# Patient Record
Sex: Female | Born: 1950 | Race: White | Hispanic: No | Marital: Married | Smoking: Never smoker
Health system: Southern US, Community
[De-identification: ages and names within clinical notes are randomized; demographics above are authoritative.]

## PROBLEM LIST (undated history)

## (undated) DIAGNOSIS — M1990 Unspecified osteoarthritis, unspecified site: Secondary | ICD-10-CM

## (undated) DIAGNOSIS — R112 Nausea with vomiting, unspecified: Secondary | ICD-10-CM

## (undated) DIAGNOSIS — Z9889 Other specified postprocedural states: Secondary | ICD-10-CM

## (undated) HISTORY — PX: OTHER SURGICAL HISTORY: SHX169

## (undated) HISTORY — PX: FRACTURE SURGERY: SHX138

## (undated) HISTORY — PX: ABDOMINAL HYSTERECTOMY: SHX81

## (undated) HISTORY — PX: CHOLECYSTECTOMY: SHX55

---

## 1999-02-24 ENCOUNTER — Encounter: Payer: Self-pay | Admitting: Emergency Medicine

## 1999-02-24 ENCOUNTER — Emergency Department (HOSPITAL_COMMUNITY): Admission: EM | Admit: 1999-02-24 | Discharge: 1999-02-24 | Payer: Self-pay | Admitting: Emergency Medicine

## 2000-07-07 ENCOUNTER — Emergency Department (HOSPITAL_COMMUNITY): Admission: EM | Admit: 2000-07-07 | Discharge: 2000-07-07 | Payer: Self-pay | Admitting: Emergency Medicine

## 2000-07-07 ENCOUNTER — Encounter: Payer: Self-pay | Admitting: Emergency Medicine

## 2000-09-27 ENCOUNTER — Emergency Department (HOSPITAL_COMMUNITY): Admission: EM | Admit: 2000-09-27 | Discharge: 2000-09-28 | Payer: Self-pay | Admitting: Emergency Medicine

## 2000-09-28 ENCOUNTER — Encounter: Payer: Self-pay | Admitting: Emergency Medicine

## 2002-10-29 ENCOUNTER — Emergency Department (HOSPITAL_COMMUNITY): Admission: EM | Admit: 2002-10-29 | Discharge: 2002-10-29 | Payer: Self-pay | Admitting: Emergency Medicine

## 2002-10-29 ENCOUNTER — Encounter: Payer: Self-pay | Admitting: Emergency Medicine

## 2007-12-13 ENCOUNTER — Ambulatory Visit (HOSPITAL_COMMUNITY): Admission: RE | Admit: 2007-12-13 | Discharge: 2007-12-14 | Payer: Self-pay | Admitting: General Surgery

## 2007-12-13 ENCOUNTER — Encounter (HOSPITAL_BASED_OUTPATIENT_CLINIC_OR_DEPARTMENT_OTHER): Payer: Self-pay | Admitting: General Surgery

## 2009-04-27 ENCOUNTER — Emergency Department (HOSPITAL_COMMUNITY): Admission: EM | Admit: 2009-04-27 | Discharge: 2009-04-27 | Payer: Self-pay | Admitting: Emergency Medicine

## 2009-12-04 ENCOUNTER — Encounter: Admission: RE | Admit: 2009-12-04 | Discharge: 2009-12-04 | Payer: Self-pay | Admitting: Family Medicine

## 2011-02-02 NOTE — Op Note (Signed)
NAMESCARLET, ABAD                  ACCOUNT NO.:  [id number]   MEDICAL RECORD NO.:  [id number]          PATIENT TYPE:  AMB   LOCATION:  SDS                          FACILITY:  [facility]   PHYSICIAN:  Leonie Man, M.D.   DATE OF BIRTH:  07-Jan-1951   DATE OF PROCEDURE:  12/13/2007  DATE OF DISCHARGE:                               OPERATIVE REPORT   PREOPERATIVE DIAGNOSIS:  Chronic calculous cholecystitis.   POSTOPERATIVE DIAGNOSIS:  Chronic calculous cholecystitis.   PROCEDURE:  Laparoscopic cholecystectomy, with intraoperative  cholangiogram.   SURGEON:  Leonie Man, M.D.   ASSISTANT:  O.R. nurse.   ANESTHESIA:  General.   [name] is a 60 year old female discovered recently on abdominal  ultrasound to have multiple gallstones.  She presented due to have her  having recurrent episodes of right upper quadrant pain radiating to her  back and associated with nausea and vomiting.  Evaluation of her liver  function studies did show a mild elevation in her total bilirubin to  1.9, alkaline phosphatase was elevated to 154, AST 849, and ALT 597.  Those liver functions have since returned to normal, except for a mild  elevation in her alkaline phosphatase to 138.  Her current total  bilirubin is 0.5.  The patient comes to the operating room now for a  laparoscopic cholecystectomy and intraoperative cholangiogram after the  risks and the potential benefits of surgery have been fully discussed  with her, all questions have been answered, and consent obtained.   PROCEDURE:  The patient is positioned supinely following the induction  of satisfactory general anesthesia.  Her abdomen is prepped and draped  to be included in a sterile operative field.  We identified the patient  as Shelia Davis, and the procedure to be done as laparoscopic  cholecystectomy and cholangiogram.  I then did an open laparoscopy by  infiltrating the umbilicus and incising down into the umbilicus and  inserting a  Hasson cannula into the peritoneal cavity.  This was  insufflated to 14 mmHg pressure using carbon dioxide.  The camera is  inserted and visual exploration of the abdomen carried out.  The  gallbladder was noted to be chronically scarred with multiple adhesions  up to the wall of the gallbladder.  Liver edges were sharp.  Liver  surface is smooth.  The anterior gastric wall and duodenal sweep appear  to be normal.  None of the small or large intestine appear to be  abnormal.  The patient is status post total abdominal hysterectomy.  There were some adhesions in the pelvis, particularly on the right side.   Under direct vision, epigastric and flank ports were placed.  The  gallbladder was grasped and retracted cephalad, and dissection then  carried down into the region of the ampulla, dissecting free the cystic  artery and cystic duct, tracing the cystic artery up to its entry into  the wall of the gallbladder and the cystic duct up to the  gallbladder/cystic duct junction.  The cystic artery was doubly clipped  and transected.  The cystic duct was clipped proximally and  opened.  A  cystic duct cholangiogram was carried out by passing a Cook catheter  into the abdomen and inserting it into the cystic duct, and through it  injecting one-half strength Hypaque dye under fluoroscopic control.  The  resulting contracting cholangiogram showed prompt flow of contrast into  the duodenum, mildly dilated extrahepatic biliary radicals, but there  were no filling defects noted.  The cholangiocatheter was removed, and  the cystic duct was triply clipped and transected.  The gallbladder was  then dissected free from the liver bed using electrocautery and  maintaining hemostasis throughout the entire course of the dissection.  At the end of this dissection, the gallbladder was placed in an  Endopouch, and the liver bed was checked for hemostasis, and additional  bleeding points were treated with  electrocautery.  The camera was now  placed in the epigastric port, and the gallbladder was retrieved through  the umbilical port without difficulty.  Both the flank trocars were  removed under direct vision, and the epigastric port was used to  evacuate the remaining CO2 from the abdomen.  The abdominal wound was  then closed as follows, after sponge, instrument, and sharp counts were  verified.  Umbilical wound closed in two layers with 0 Vicryl and 4-0  Monocryl.  Epigastric and flank wounds were closed with 4-0 Monocryl  sutures.  All incisions were reinforced with Steri-Strips, and sterile  dressings applied.  Anesthetic reversed.  The patient was removed from  the operating room to the recovery room in stable condition.  She  tolerated the procedure well.      Leonie Man, M.D.  Electronically Signed     PB/MEDQ  D:  12/13/2007  T:  12/14/2007  Job:  [id number]   cc:   Holley Bouche, M.D.

## 2011-03-10 ENCOUNTER — Other Ambulatory Visit: Payer: Self-pay | Admitting: Family Medicine

## 2011-03-10 DIAGNOSIS — K7689 Other specified diseases of liver: Secondary | ICD-10-CM

## 2011-04-08 ENCOUNTER — Ambulatory Visit
Admission: RE | Admit: 2011-04-08 | Discharge: 2011-04-08 | Disposition: A | Discharge status: Home / Self Care | Payer: 59 | Source: Ambulatory Visit | Attending: Family Medicine | Admitting: Family Medicine

## 2011-04-08 DIAGNOSIS — K7689 Other specified diseases of liver: Secondary | ICD-10-CM

## 2011-06-14 LAB — DIFFERENTIAL
Basophils Absolute: 0.1
Basophils Relative: 1
Lymphocytes Relative: 34
Neutro Abs: 5.8

## 2011-06-14 LAB — COMPREHENSIVE METABOLIC PANEL
Alkaline Phosphatase: 138 — ABNORMAL HIGH
BUN: 5 — ABNORMAL LOW
CO2: 30
Chloride: 102
Creatinine, Ser: 0.67
GFR calc non Af Amer: >60
Glucose, Bld: 74
Potassium: 3.7
Total Bilirubin: 0.5

## 2011-06-14 LAB — CBC
HCT: 44.4
Hemoglobin: 14.9
MCV: 85.9
RBC: 5.16 — ABNORMAL HIGH
WBC: 10.4

## 2011-06-14 LAB — PROTIME-INR: Prothrombin Time: 12.3

## 2011-06-14 LAB — LIPASE, BLOOD: Lipase: 21

## 2012-07-22 ENCOUNTER — Encounter (HOSPITAL_BASED_OUTPATIENT_CLINIC_OR_DEPARTMENT_OTHER): Payer: Self-pay | Admitting: *Deleted

## 2012-07-22 ENCOUNTER — Emergency Department (HOSPITAL_BASED_OUTPATIENT_CLINIC_OR_DEPARTMENT_OTHER): Payer: 59

## 2012-07-22 ENCOUNTER — Emergency Department (HOSPITAL_BASED_OUTPATIENT_CLINIC_OR_DEPARTMENT_OTHER)
Admission: EM | Admit: 2012-07-22 | Discharge: 2012-07-22 | Disposition: A | Discharge status: Home / Self Care | Payer: 59 | Attending: Emergency Medicine | Admitting: Emergency Medicine

## 2012-07-22 DIAGNOSIS — S82409A Unspecified fracture of shaft of unspecified fibula, initial encounter for closed fracture: Secondary | ICD-10-CM | POA: Insufficient documentation

## 2012-07-22 DIAGNOSIS — S82401A Unspecified fracture of shaft of right fibula, initial encounter for closed fracture: Secondary | ICD-10-CM

## 2012-07-22 DIAGNOSIS — S62308A Unspecified fracture of other metacarpal bone, initial encounter for closed fracture: Secondary | ICD-10-CM

## 2012-07-22 DIAGNOSIS — Z888 Allergy status to other drugs, medicaments and biological substances status: Secondary | ICD-10-CM | POA: Insufficient documentation

## 2012-07-22 DIAGNOSIS — Y929 Unspecified place or not applicable: Secondary | ICD-10-CM | POA: Insufficient documentation

## 2012-07-22 DIAGNOSIS — S92309A Fracture of unspecified metatarsal bone(s), unspecified foot, initial encounter for closed fracture: Secondary | ICD-10-CM | POA: Insufficient documentation

## 2012-07-22 DIAGNOSIS — M159 Polyosteoarthritis, unspecified: Secondary | ICD-10-CM | POA: Insufficient documentation

## 2012-07-22 DIAGNOSIS — Y9301 Activity, walking, marching and hiking: Secondary | ICD-10-CM | POA: Insufficient documentation

## 2012-07-22 DIAGNOSIS — W1789XA Other fall from one level to another, initial encounter: Secondary | ICD-10-CM | POA: Insufficient documentation

## 2012-07-22 HISTORY — DX: Unspecified osteoarthritis, unspecified site: M19.90

## 2012-07-22 MED ORDER — ONDANSETRON 4 MG PO TBDP
4.0000 mg | ORAL_TABLET | Freq: Once | ORAL | Status: AC
  Administered 2012-07-22: 4 mg via ORAL
  Filled 2012-07-22: qty 1

## 2012-07-22 MED ORDER — HYDROCODONE-ACETAMINOPHEN 5-325 MG PO TABS: 2.0000 | ORAL_TABLET | ORAL | Status: DC | PRN

## 2012-07-22 MED ORDER — HYDROCODONE-ACETAMINOPHEN 5-325 MG PO TABS
2.0000 | ORAL_TABLET | Freq: Once | ORAL | Status: AC
  Administered 2012-07-22: 2 via ORAL
  Filled 2012-07-22: qty 2

## 2012-07-22 MED ORDER — ONDANSETRON 4 MG PO TBDP: 4.0000 mg | ORAL_TABLET | Freq: Three times a day (TID) | ORAL | Status: DC | PRN

## 2012-07-22 NOTE — ED Provider Notes (Signed)
History     CSN: [id number]  Arrival date & time 07/22/12  1326   First MD Initiated Contact with Patient 07/22/12 1438      Chief Complaint  Patient presents with  . Ankle Injury    (Consider location/radiation/quality/duration/timing/severity/associated sxs/prior treatment) Patient is a 61 y.o. female presenting with fall. The history is provided by the patient. No language interpreter was used.  Fall The accident occurred less than 1 hour ago. The fall occurred while walking. She fell from a height of 3 to 5 ft. She landed on grass. There was no blood loss. Point of impact: foot. Pain location: foot and ankle r. The pain is at a severity of 7/10. The pain is moderate. She was not ambulatory at the scene. There was no entrapment after the fall. The symptoms are aggravated by activity. She has tried nothing for the symptoms.  Pt complains of pain in both feet and both ankles after tripping over a rock this am.  Past Medical History  Diagnosis Date  . Osteoarthritis     Past Surgical History  Procedure Date  . Cholecystectomy   . Abdominal hysterectomy   . Neck fusion     History reviewed. No pertinent family history.  History  Substance Use Topics  . Smoking status: Never Smoker   . Smokeless tobacco: Not on file  . Alcohol Use: No    OB History    Grav Para Term Preterm Abortions TAB SAB Ect Mult Living                  Review of Systems  Musculoskeletal: Positive for joint swelling and gait problem.  All other systems reviewed and are negative.    Allergies  Phenergan  Home Medications   Current Outpatient Rx  Name Route Sig Dispense Refill  . ALENDRONATE SODIUM 40 MG PO TABS Oral Take 40 mg by mouth every 7 (seven) days. Take with a full glass of water on an empty stomach.      BP 126/104  Pulse 82  Temp 98.1 F (36.7 C)  Resp 20  SpO2 98%  Physical Exam  Nursing note and vitals reviewed. Constitutional: She is oriented to person, place, and  time. She appears well-developed and well-nourished.  HENT:  Head: Normocephalic and atraumatic.  Musculoskeletal: She exhibits edema and tenderness.       Tender swollen right ankle and right foot,    Tender left ankle and left foot,  Pain with moving both feet and ankles.  nv and ns intact  Neurological: She is alert and oriented to person, place, and time. She has normal reflexes.  Skin: Skin is warm.  Psychiatric: She has a normal mood and affect.    ED Course  Procedures (including critical care time)  Labs Reviewed - No data to display Dg Ankle Complete Right  07/22/2012  *RADIOLOGY REPORT*  Clinical Data: ankle injury  RIGHT ANKLE - COMPLETE 3+ VIEW  Comparison: None.  Findings:  There is a minimally displaced fracture involving the lateral malleolus.  Moderate soft tissue swelling is noted.  A comminuted fracture deformity involves the base of the fifth metatarsal with retraction of the fracture fragments by approximately 9 mm.  There is a small posterior calcaneal heel spur.  IMPRESSION:  1.  Comminuted fracture involves the base of the fifth metatarsal bone. 2.  Mildly displaced fracture of the lateral malleolus.   Original Report Authenticated By: Signa Kell, M.D.      No diagnosis  found.    MDM  Pt placed in a posterior splint and given crutches.   I counseled pt on break in foot and ankle.   Pt advised to see Dr. Rayburn Ma for recheck on next week.  Ice to area of pain        Lonia Skinner [place], [place] 07/22/12 1556

## 2012-07-22 NOTE — ED Notes (Signed)
On arrival to patients room to assist with bedpan, patient appears anxious and aggitated. She expresses that in fact, both of her ankles are in excruciating pain. I assisted the patient with bedpan use. Additional ice packs and pillows provided to patient for comfort. Clydie Braun, PA also at bedside to hear patient concerns.Patient has no additional needs at this time.

## 2012-07-22 NOTE — ED Notes (Signed)
Pt presents to ED today with right ankle injury after tripping over rock/curb.  Pt has some swelling noted to right ankle

## 2012-07-22 NOTE — Progress Notes (Signed)
Applied posterior splint to patients right lower extremity. Crutches ordered. Patient refuses crutches stating "I don't need them. I will not use them." Patient expresses that she has a walker at her house that she will use to ambulate. Patient strongly encouraged to use the device and discussed the potential dangers of bearing weight on this extremity. Patient expresses understanding.

## 2012-07-23 NOTE — ED Provider Notes (Signed)
Medical screening examination/treatment/procedure(s) were performed by non-physician practitioner and as supervising physician I was immediately available for consultation/collaboration.  Lashun Ramseyer K Linker, MD 07/23/12 0708 

## 2013-05-25 ENCOUNTER — Ambulatory Visit
Admission: RE | Admit: 2013-05-25 | Discharge: 2013-05-25 | Disposition: A | Discharge status: Home / Self Care | Payer: 59 | Source: Ambulatory Visit | Attending: Family Medicine | Admitting: Family Medicine

## 2013-05-25 ENCOUNTER — Other Ambulatory Visit: Payer: Self-pay | Admitting: Family Medicine

## 2013-05-25 DIAGNOSIS — R0789 Other chest pain: Secondary | ICD-10-CM

## 2013-06-28 ENCOUNTER — Other Ambulatory Visit: Payer: Self-pay | Admitting: Neurosurgery

## 2013-07-23 ENCOUNTER — Encounter (HOSPITAL_COMMUNITY): Payer: Self-pay

## 2013-07-27 ENCOUNTER — Encounter (INDEPENDENT_AMBULATORY_CARE_PROVIDER_SITE_OTHER): Payer: Self-pay

## 2013-07-27 ENCOUNTER — Encounter (HOSPITAL_COMMUNITY): Payer: Self-pay

## 2013-07-27 ENCOUNTER — Encounter (HOSPITAL_COMMUNITY)
Admission: RE | Admit: 2013-07-27 | Discharge: 2013-07-27 | Disposition: A | Discharge status: Home / Self Care | Payer: 59 | Source: Ambulatory Visit | Attending: Neurosurgery | Admitting: Neurosurgery

## 2013-07-27 DIAGNOSIS — Z01812 Encounter for preprocedural laboratory examination: Secondary | ICD-10-CM | POA: Diagnosis present

## 2013-07-27 HISTORY — DX: Other specified postprocedural states: Z98.890

## 2013-07-27 HISTORY — DX: Other specified postprocedural states: R11.2

## 2013-07-27 LAB — BASIC METABOLIC PANEL
BUN: 14 mg/dL (ref 6–23)
Calcium: 9.7 mg/dL (ref 8.4–10.5)
Creatinine, Ser: 0.63 mg/dL (ref 0.50–1.10)
GFR calc Af Amer: >90 mL/min (ref >90)

## 2013-07-27 LAB — CBC
MCH: 28.5 pg (ref 26.0–34.0)
MCHC: 33.7 g/dL (ref 30.0–36.0)
MCV: 84.5 fL (ref 78.0–100.0)
Platelets: 268 10*3/uL (ref 150–400)
RDW: 15.3 % (ref 11.5–15.5)

## 2013-07-27 LAB — SURGICAL PCR SCREEN: MRSA, PCR: NEGATIVE

## 2013-07-27 NOTE — Pre-Procedure Instructions (Signed)
Shelia Davis  07/27/2013   Your procedure is scheduled on:  08/07/13  Report to [facility]  2 * 3 at 530 AM.  Call this number if you have problems the morning of surgery: [phone]   Remember:   Do not eat food or drink liquids after midnight.   Take these medicines the morning of surgery with A SIP OF WATER: hydrocodone,clartin   Do not wear jewelry, make-up or nail polish.  Do not wear lotions, powders, or perfumes. You may wear deodorant.  Do not shave 48 hours prior to surgery. Men may shave face and neck.  Do not bring valuables to the hospital.  [facility] is not responsible                  for any belongings or valuables.               Contacts, dentures or bridgework may not be worn into surgery.  Leave suitcase in the car. After surgery it may be brought to your room.  For patients admitted to the hospital, discharge time is determined by your                treatment team.               Patients discharged the day of surgery will not be allowed to drive  home.  Name and phone number of your driver: family  Special Instructions: Shower using CHG 2 nights before surgery and the night before surgery.  If you shower the day of surgery use CHG.  Use special wash - you have one bottle of CHG for all showers.  You should use approximately 1/3 of the bottle for each shower.   Please read over the following fact sheets that you were given: Pain Booklet, Coughing and Deep Breathing, MRSA Information and Surgical Site Infection Prevention

## 2013-08-06 MED ORDER — CEFAZOLIN SODIUM-DEXTROSE 2-3 GM-% IV SOLR
2.0000 g | INTRAVENOUS | Status: AC
  Administered 2013-08-07: 2 g via INTRAVENOUS
  Filled 2013-08-06: qty 50

## 2013-08-06 NOTE — H&P (Signed)
Shelia Davis is an 62 y.o. female.   Chief Complaint: neck pain HPI: patient complaining of neck pain with radiation to the left trapezius muscle, no better with conservative treatment,. In the past she has a two level fusion at [age].  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 Osteoarthritis   . PONV (postoperative nausea and vomiting)     Past Surgical History  Procedure Laterality Date  . Cholecystectomy    . Abdominal hysterectomy    . Neck fusion    . Falls      numerous breaks from falls,wrist X3, fingers,lt leg,ankle,foot  . Fracture surgery      freq. falls    No family history on file. Social History:  reports that she has never smoked. She does not have any smokeless tobacco history on file. She reports that she does not drink alcohol or use illicit drugs.  Allergies:  Allergies  Allergen Reactions  . Phenergan [Promethazine Hcl]     Pt had past severe reaction     Severe  vomiting    No prescriptions prior to admission    No results found for this or any previous visit (from the past 48 hour(s)). No results found.  Review of Systems  Constitutional: Negative.   Eyes: Negative.   Respiratory: Negative.   Cardiovascular: Negative.   Genitourinary: Negative.   Musculoskeletal: Positive for neck pain.  Skin: Negative.   Neurological: Positive for sensory change and focal weakness.  Endo/Heme/Allergies: Negative.   Psychiatric/Behavioral: Negative.     There were no vitals taken for this visit. Physical Exam hent, nl. Neck, anterior scar. Pain with movement. Cv, nl. Lungs, clear. Abdomen, soft. Extremities, nl. Neuro no weahness , sensory some tinnel sign at wrist ,left.  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 Plan will be to decompress and fuse c-34,. Patient aware of risks and benefits  Dardan Shelton M 08/06/2013, 8:36 PM

## 2013-08-07 ENCOUNTER — Encounter (HOSPITAL_COMMUNITY): Payer: 59 | Admitting: Anesthesiology

## 2013-08-07 ENCOUNTER — Inpatient Hospital Stay (HOSPITAL_COMMUNITY)
Admission: RE | Admit: 2013-08-07 | Discharge: 2013-08-13 | DRG: 473 | Disposition: A | Discharge status: Rehabilitation Facility | Payer: 59 | Source: Ambulatory Visit | Attending: Neurosurgery | Admitting: Neurosurgery

## 2013-08-07 ENCOUNTER — Encounter (HOSPITAL_COMMUNITY)
Admission: RE | Disposition: A | Discharge status: Rehabilitation Facility | Payer: Self-pay | Source: Ambulatory Visit | Attending: Neurosurgery

## 2013-08-07 ENCOUNTER — Encounter (HOSPITAL_COMMUNITY): Payer: Self-pay | Admitting: Surgery

## 2013-08-07 ENCOUNTER — Ambulatory Visit (HOSPITAL_COMMUNITY): Payer: 59 | Admitting: Anesthesiology

## 2013-08-07 ENCOUNTER — Ambulatory Visit (HOSPITAL_COMMUNITY): Payer: 59

## 2013-08-07 DIAGNOSIS — M4802 Spinal stenosis, cervical region: Principal | ICD-10-CM | POA: Diagnosis present

## 2013-08-07 DIAGNOSIS — Z9181 History of falling: Secondary | ICD-10-CM

## 2013-08-07 DIAGNOSIS — Z981 Arthrodesis status: Secondary | ICD-10-CM

## 2013-08-07 DIAGNOSIS — M1990 Unspecified osteoarthritis, unspecified site: Secondary | ICD-10-CM | POA: Diagnosis present

## 2013-08-07 HISTORY — PX: ANTERIOR CERVICAL DECOMP/DISCECTOMY FUSION: SHX1161

## 2013-08-07 SURGERY — ANTERIOR CERVICAL DECOMPRESSION/DISCECTOMY FUSION 1 LEVEL
Anesthesia: General | Wound class: Clean

## 2013-08-07 MED ORDER — LIDOCAINE HCL (CARDIAC) 20 MG/ML IV SOLN
INTRAVENOUS | Status: DC | PRN
  Administered 2013-08-07: 100 mg via INTRAVENOUS
  Administered 2013-08-07: 60 mg via INTRAVENOUS

## 2013-08-07 MED ORDER — NEOSTIGMINE METHYLSULFATE 1 MG/ML IJ SOLN
INTRAMUSCULAR | Status: DC | PRN
  Administered 2013-08-07: 2 mg via INTRAVENOUS

## 2013-08-07 MED ORDER — HYDROMORPHONE HCL PF 1 MG/ML IJ SOLN
0.2500 mg | INTRAMUSCULAR | Status: DC | PRN
  Administered 2013-08-07 (×3): 0.5 mg via INTRAVENOUS

## 2013-08-07 MED ORDER — PHENOL 1.4 % MT LIQD
1.0000 | OROMUCOSAL | Status: DC | PRN
  Administered 2013-08-08 – 2013-08-13 (×4): 1 via OROMUCOSAL
  Filled 2013-08-07: qty 177

## 2013-08-07 MED ORDER — ACETAMINOPHEN 325 MG PO TABS: 650.0000 mg | ORAL_TABLET | ORAL | Status: DC | PRN

## 2013-08-07 MED ORDER — THROMBIN 5000 UNITS EX SOLR
CUTANEOUS | Status: DC | PRN
  Administered 2013-08-07: 5000 [IU] via TOPICAL

## 2013-08-07 MED ORDER — CEFAZOLIN SODIUM 1-5 GM-% IV SOLN
1.0000 g | Freq: Three times a day (TID) | INTRAVENOUS | Status: AC
  Administered 2013-08-07 (×2): 1 g via INTRAVENOUS
  Filled 2013-08-07 (×2): qty 50

## 2013-08-07 MED ORDER — MORPHINE SULFATE 2 MG/ML IJ SOLN
1.0000 mg | INTRAMUSCULAR | Status: DC | PRN
  Administered 2013-08-07 – 2013-08-08 (×4): 4 mg via INTRAVENOUS
  Filled 2013-08-07 (×4): qty 2

## 2013-08-07 MED ORDER — LORATADINE 10 MG PO TABS
10.0000 mg | ORAL_TABLET | Freq: Every day | ORAL | Status: DC
  Administered 2013-08-10 – 2013-08-13 (×4): 10 mg via ORAL
  Filled 2013-08-07 (×6): qty 1

## 2013-08-07 MED ORDER — THROMBIN 5000 UNITS EX SOLR
OROMUCOSAL | Status: DC | PRN
  Administered 2013-08-07: 09:00:00 via TOPICAL

## 2013-08-07 MED ORDER — SODIUM CHLORIDE 0.9 % IV SOLN: 250.0000 mL | INTRAVENOUS | Status: DC

## 2013-08-07 MED ORDER — OXYCODONE HCL 5 MG PO TABS
ORAL_TABLET | ORAL | Status: AC
  Filled 2013-08-07: qty 1

## 2013-08-07 MED ORDER — HEMOSTATIC AGENTS (NO CHARGE) OPTIME
TOPICAL | Status: DC | PRN
  Administered 2013-08-07: 1 via TOPICAL

## 2013-08-07 MED ORDER — DIAZEPAM 5 MG PO TABS
ORAL_TABLET | ORAL | Status: AC
  Administered 2013-08-07: 5 mg
  Filled 2013-08-07: qty 1

## 2013-08-07 MED ORDER — OXYCODONE-ACETAMINOPHEN 5-325 MG PO TABS
1.0000 | ORAL_TABLET | ORAL | Status: DC | PRN
  Administered 2013-08-07 – 2013-08-10 (×6): 2 via ORAL
  Administered 2013-08-10: 1 via ORAL
  Administered 2013-08-10: 2 via ORAL
  Administered 2013-08-10 – 2013-08-13 (×7): 1 via ORAL
  Filled 2013-08-07: qty 2
  Filled 2013-08-07: qty 1
  Filled 2013-08-07 (×2): qty 2
  Filled 2013-08-07: qty 1
  Filled 2013-08-07 (×2): qty 2
  Filled 2013-08-07: qty 1
  Filled 2013-08-07 (×3): qty 2
  Filled 2013-08-07: qty 1
  Filled 2013-08-07: qty 2
  Filled 2013-08-07 (×2): qty 1
  Filled 2013-08-07: qty 2

## 2013-08-07 MED ORDER — DEXAMETHASONE SODIUM PHOSPHATE 4 MG/ML IJ SOLN
4.0000 mg | Freq: Four times a day (QID) | INTRAMUSCULAR | Status: DC
  Administered 2013-08-07 – 2013-08-10 (×5): 4 mg via INTRAVENOUS
  Filled 2013-08-07 (×28): qty 1

## 2013-08-07 MED ORDER — ONDANSETRON HCL 4 MG/2ML IJ SOLN
INTRAMUSCULAR | Status: DC | PRN
  Administered 2013-08-07 (×2): 4 mg via INTRAVENOUS

## 2013-08-07 MED ORDER — ONDANSETRON 4 MG PO TBDP
4.0000 mg | ORAL_TABLET | Freq: Three times a day (TID) | ORAL | Status: DC | PRN
  Administered 2013-08-09: 4 mg via ORAL
  Filled 2013-08-07: qty 1

## 2013-08-07 MED ORDER — DEXAMETHASONE SODIUM PHOSPHATE 4 MG/ML IJ SOLN
INTRAMUSCULAR | Status: DC | PRN
  Administered 2013-08-07: 8 mg via INTRAVENOUS

## 2013-08-07 MED ORDER — ONDANSETRON HCL 4 MG/2ML IJ SOLN: 4.0000 mg | Freq: Four times a day (QID) | INTRAMUSCULAR | Status: DC | PRN

## 2013-08-07 MED ORDER — ACETAMINOPHEN 650 MG RE SUPP: 650.0000 mg | RECTAL | Status: DC | PRN

## 2013-08-07 MED ORDER — HYDROMORPHONE HCL PF 1 MG/ML IJ SOLN
INTRAMUSCULAR | Status: AC
  Administered 2013-08-07: 0.5 mg via INTRAVENOUS
  Filled 2013-08-07: qty 1

## 2013-08-07 MED ORDER — MENTHOL 3 MG MT LOZG: 1.0000 | LOZENGE | OROMUCOSAL | Status: DC | PRN

## 2013-08-07 MED ORDER — SODIUM CHLORIDE 0.9 % IJ SOLN: 3.0000 mL | INTRAMUSCULAR | Status: DC | PRN

## 2013-08-07 MED ORDER — HYDROMORPHONE HCL PF 1 MG/ML IJ SOLN
INTRAMUSCULAR | Status: AC
  Administered 2013-08-07: 0.5 mg
  Filled 2013-08-07: qty 1

## 2013-08-07 MED ORDER — FENTANYL CITRATE 0.05 MG/ML IJ SOLN
INTRAMUSCULAR | Status: DC | PRN
  Administered 2013-08-07: 250 ug via INTRAVENOUS

## 2013-08-07 MED ORDER — GLYCOPYRROLATE 0.2 MG/ML IJ SOLN
INTRAMUSCULAR | Status: DC | PRN
  Administered 2013-08-07: .3 mg via INTRAVENOUS

## 2013-08-07 MED ORDER — SODIUM CHLORIDE 0.9 % IJ SOLN
3.0000 mL | Freq: Two times a day (BID) | INTRAMUSCULAR | Status: DC
  Administered 2013-08-08: 3 mL via INTRAVENOUS

## 2013-08-07 MED ORDER — ONDANSETRON HCL 4 MG/2ML IJ SOLN
4.0000 mg | INTRAMUSCULAR | Status: DC | PRN
  Administered 2013-08-07 – 2013-08-08 (×3): 4 mg via INTRAVENOUS
  Filled 2013-08-07 (×3): qty 2

## 2013-08-07 MED ORDER — LACTATED RINGERS IV SOLN
INTRAVENOUS | Status: DC | PRN
  Administered 2013-08-07 (×2): via INTRAVENOUS

## 2013-08-07 MED ORDER — ROCURONIUM BROMIDE 100 MG/10ML IV SOLN
INTRAVENOUS | Status: DC | PRN
  Administered 2013-08-07: 50 mg via INTRAVENOUS

## 2013-08-07 MED ORDER — 0.9 % SODIUM CHLORIDE (POUR BTL) OPTIME
TOPICAL | Status: DC | PRN
  Administered 2013-08-07: 1000 mL

## 2013-08-07 MED ORDER — PROPOFOL INFUSION 10 MG/ML OPTIME
INTRAVENOUS | Status: DC | PRN
  Administered 2013-08-07: 180 ug/kg/min via INTRAVENOUS

## 2013-08-07 MED ORDER — MIDAZOLAM HCL 5 MG/5ML IJ SOLN
INTRAMUSCULAR | Status: DC | PRN
  Administered 2013-08-07: 2 mg via INTRAVENOUS

## 2013-08-07 MED ORDER — SODIUM CHLORIDE 0.9 % IV SOLN: INTRAVENOUS | Status: DC

## 2013-08-07 MED ORDER — OXYCODONE HCL 5 MG/5ML PO SOLN: 5.0000 mg | Freq: Once | ORAL | Status: AC | PRN

## 2013-08-07 MED ORDER — PROPOFOL 10 MG/ML IV BOLUS
INTRAVENOUS | Status: DC | PRN
  Administered 2013-08-07: 100 mg via INTRAVENOUS
  Administered 2013-08-07: 150 mg via INTRAVENOUS
  Administered 2013-08-07: 100 mg via INTRAVENOUS
  Administered 2013-08-07: 20 mg via INTRAVENOUS
  Administered 2013-08-07: 100 mg via INTRAVENOUS

## 2013-08-07 MED ORDER — SCOPOLAMINE 1 MG/3DAYS TD PT72
MEDICATED_PATCH | TRANSDERMAL | Status: AC
  Administered 2013-08-07: 1 via TRANSDERMAL
  Filled 2013-08-07: qty 1

## 2013-08-07 MED ORDER — DIAZEPAM 5 MG PO TABS
5.0000 mg | ORAL_TABLET | Freq: Four times a day (QID) | ORAL | Status: DC | PRN
  Administered 2013-08-10 – 2013-08-12 (×3): 5 mg via ORAL
  Filled 2013-08-07 (×3): qty 1

## 2013-08-07 MED ORDER — OXYCODONE HCL 5 MG PO TABS
5.0000 mg | ORAL_TABLET | Freq: Once | ORAL | Status: AC | PRN
  Administered 2013-08-07: 5 mg via ORAL

## 2013-08-07 MED ORDER — DEXAMETHASONE 4 MG PO TABS
4.0000 mg | ORAL_TABLET | Freq: Four times a day (QID) | ORAL | Status: DC
  Administered 2013-08-08 – 2013-08-13 (×20): 4 mg via ORAL
  Filled 2013-08-07 (×27): qty 1

## 2013-08-07 SURGICAL SUPPLY — 54 items
BANDAGE GAUZE ELAST BULKY 4 IN (GAUZE/BANDAGES/DRESSINGS) ×4 IMPLANT
BENZOIN TINCTURE PRP APPL 2/3 (GAUZE/BANDAGES/DRESSINGS) ×2 IMPLANT
BIT DRILL SPINE QC 12 (BIT) ×2 IMPLANT
BLADE ULTRA TIP 2M (BLADE) ×2 IMPLANT
BUR BARREL STRAIGHT FLUTE 4.0 (BURR) IMPLANT
BUR MATCHSTICK NEURO 3.0 LAGG (BURR) ×2 IMPLANT
CANISTER SUCT 3000ML (MISCELLANEOUS) ×2 IMPLANT
CONT SPEC 4OZ CLIKSEAL STRL BL (MISCELLANEOUS) ×2 IMPLANT
COVER MAYO STAND STRL (DRAPES) ×2 IMPLANT
DRAPE C-ARM 42X72 X-RAY (DRAPES) ×4 IMPLANT
DRAPE LAPAROTOMY 100X72 PEDS (DRAPES) ×2 IMPLANT
DRAPE MICROSCOPE LEICA (MISCELLANEOUS) ×2 IMPLANT
DRAPE POUCH INSTRU U-SHP 10X18 (DRAPES) ×2 IMPLANT
DURAPREP 6ML APPLICATOR 50/CS (WOUND CARE) ×2 IMPLANT
ELECT REM PT RETURN 9FT ADLT (ELECTROSURGICAL) ×2
ELECTRODE REM PT RTRN 9FT ADLT (ELECTROSURGICAL) ×1 IMPLANT
GAUZE SPONGE 4X4 16PLY XRAY LF (GAUZE/BANDAGES/DRESSINGS) IMPLANT
GLOVE BIOGEL M 8.0 STRL (GLOVE) ×2 IMPLANT
GLOVE BIOGEL PI IND STRL 7.5 (GLOVE) ×2 IMPLANT
GLOVE BIOGEL PI INDICATOR 7.5 (GLOVE) ×2
GLOVE ECLIPSE 7.0 STRL STRAW (GLOVE) ×2 IMPLANT
GLOVE ECLIPSE 8.0 STRL XLNG CF (GLOVE) ×2 IMPLANT
GLOVE EXAM NITRILE LRG STRL (GLOVE) IMPLANT
GLOVE EXAM NITRILE MD LF STRL (GLOVE) IMPLANT
GLOVE EXAM NITRILE XL STR (GLOVE) IMPLANT
GLOVE EXAM NITRILE XS STR PU (GLOVE) IMPLANT
GLOVE SURG SS PI 7.0 STRL IVOR (GLOVE) ×4 IMPLANT
GOWN BRE IMP SLV AUR LG STRL (GOWN DISPOSABLE) ×4 IMPLANT
GOWN BRE IMP SLV AUR XL STRL (GOWN DISPOSABLE) ×2 IMPLANT
GOWN STRL REIN 2XL LVL4 (GOWN DISPOSABLE) IMPLANT
HEMOSTAT POWDER KIT SURGIFOAM (HEMOSTASIS) ×2 IMPLANT
KIT BASIN OR (CUSTOM PROCEDURE TRAY) ×2 IMPLANT
KIT ROOM TURNOVER OR (KITS) ×2 IMPLANT
NEEDLE SPNL 22GX3.5 QUINCKE BK (NEEDLE) ×2 IMPLANT
NS IRRIG 1000ML POUR BTL (IV SOLUTION) ×2 IMPLANT
PACK LAMINECTOMY NEURO (CUSTOM PROCEDURE TRAY) ×2 IMPLANT
PATTIES SURGICAL .5 X1 (DISPOSABLE) ×2 IMPLANT
PLATE ANT CERV XTEND 1 LV 18 (Plate) ×2 IMPLANT
PUTTY DBX 1CC (Putty) ×2 IMPLANT
PUTTY DBX 1CC DEPUY (Putty) ×1 IMPLANT
RUBBERBAND STERILE (MISCELLANEOUS) ×4 IMPLANT
SCREW XTD VAR 4.2 SELF TAP 12 (Screw) ×4 IMPLANT
SCREW XTEND SELFTAP VAR 4.6X14 (Screw) ×2 IMPLANT
SPACER ACDF SM LORDOTIC 7 (Spacer) ×2 IMPLANT
SPONGE GAUZE 4X4 12PLY (GAUZE/BANDAGES/DRESSINGS) ×2 IMPLANT
SPONGE INTESTINAL PEANUT (DISPOSABLE) ×4 IMPLANT
SPONGE SURGIFOAM ABS GEL SZ50 (HEMOSTASIS) ×2 IMPLANT
STRIP CLOSURE SKIN 1/2X4 (GAUZE/BANDAGES/DRESSINGS) ×2 IMPLANT
SUT VIC AB 3-0 SH 8-18 (SUTURE) ×2 IMPLANT
SYR 20ML ECCENTRIC (SYRINGE) ×2 IMPLANT
TAPE CLOTH SURG 4X10 WHT LF (GAUZE/BANDAGES/DRESSINGS) ×2 IMPLANT
TOWEL OR 17X24 6PK STRL BLUE (TOWEL DISPOSABLE) ×2 IMPLANT
TOWEL OR 17X26 10 PK STRL BLUE (TOWEL DISPOSABLE) ×2 IMPLANT
WATER STERILE IRR 1000ML POUR (IV SOLUTION) ×2 IMPLANT

## 2013-08-07 NOTE — Anesthesia Preprocedure Evaluation (Signed)
Anesthesia Evaluation  Patient identified by MRN, date of birth, ID band Patient awake    Reviewed: Allergy & Precautions, H&P , NPO status , Patient's Chart, lab work & pertinent test results  History of Anesthesia Complications (+) PONV  Airway Mallampati: II  Neck ROM: full    Dental   Pulmonary neg pulmonary ROS,          Cardiovascular negative cardio ROS      Neuro/Psych    GI/Hepatic   Endo/Other  obese  Renal/GU      Musculoskeletal   Abdominal   Peds  Hematology   Anesthesia Other Findings   Reproductive/Obstetrics                           Anesthesia Physical Anesthesia Plan  ASA: II  Anesthesia Plan: General   Post-op Pain Management:    Induction: Intravenous  Airway Management Planned: Oral ETT  Additional Equipment:   Intra-op Plan:   Post-operative Plan: Extubation in OR  Informed Consent: I have reviewed the patients History and Physical, chart, labs and discussed the procedure including the risks, benefits and alternatives for the proposed anesthesia with the patient or authorized representative who has indicated his/her understanding and acceptance.     Plan Discussed with: CRNA, Anesthesiologist and Surgeon  Anesthesia Plan Comments:         Anesthesia Quick Evaluation

## 2013-08-07 NOTE — Transfer of Care (Signed)
Immediate Anesthesia Transfer of Care Note  Patient: Shelia Davis  Procedure(s) Performed: Procedure(s) with comments: CERVICAL THREE-FOUR ANTERIOR CERVICAL DECOMPRESSION/DISCECTOMY FUSION 1 LEVEL (N/A) - C3-4 Anterior cervical decompression/diskectomy/fusion  Patient Location: PACU  Anesthesia Type:General  Level of Consciousness: awake, alert  and oriented  Airway & Oxygen Therapy: Patient Spontanous Breathing and Patient connected to face mask oxygen  Post-op Assessment: Report given to PACU RN and Post -op Vital signs reviewed and stable  Post vital signs: Reviewed and stable  Complications: No apparent anesthesia complications

## 2013-08-07 NOTE — Anesthesia Postprocedure Evaluation (Signed)
Anesthesia Post Note  Patient: Shelia Davis  Procedure(s) Performed: Procedure(s) (LRB): CERVICAL THREE-FOUR ANTERIOR CERVICAL DECOMPRESSION/DISCECTOMY FUSION 1 LEVEL (N/A)  Anesthesia type: General  Patient location: PACU  Post pain: Pain level controlled and Adequate analgesia  Post assessment: Post-op Vital signs reviewed, Patient's Cardiovascular Status Stable, Respiratory Function Stable, Patent Airway and Pain level controlled  Last Vitals:  Filed Vitals:   08/07/13 1045  BP:   Pulse: 86  Temp:   Resp: 18    Post vital signs: Reviewed and stable  Level of consciousness: awake, alert  and oriented  Complications: No apparent anesthesia complications

## 2013-08-07 NOTE — Preoperative (Signed)
Beta Blockers   Reason not to administer Beta Blockers:Not Applicable 

## 2013-08-07 NOTE — Progress Notes (Signed)
Op note [id number]

## 2013-08-08 LAB — URINALYSIS, ROUTINE W REFLEX MICROSCOPIC
Bilirubin Urine: NEGATIVE
Glucose, UA: NEGATIVE mg/dL
Hgb urine dipstick: NEGATIVE
Ketones, ur: NEGATIVE mg/dL
Leukocytes, UA: NEGATIVE
Nitrite: NEGATIVE
Protein, ur: NEGATIVE mg/dL
Specific Gravity, Urine: 1.004 — ABNORMAL LOW (ref 1.005–1.030)
Urobilinogen, UA: 0.2 mg/dL (ref 0.0–1.0)
pH: 7 (ref 5.0–8.0)

## 2013-08-08 MED ORDER — MAGIC MOUTHWASH
5.0000 mL | Freq: Four times a day (QID) | ORAL | Status: DC
  Administered 2013-08-08 – 2013-08-13 (×19): 5 mL via ORAL
  Filled 2013-08-08 (×23): qty 5

## 2013-08-08 NOTE — Op Note (Signed)
NAMECAMALA, Shelia Davis NO.:  [id number]  MEDICAL RECORD NO.:  [id number]  LOCATION:  3C11C                        FACILITY:  [facility]  PHYSICIAN:  Hilda Lias, M.D.   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  PROCEDURES:  Anterior C3-4 diskectomy, decompression of the spinal cord, removal of a large calcified disk affecting the left C3 nerve root. Lysis of adhesions.  Interbody fusion with a cage.  Plate.  Microscope.  SURGEON:  Hilda Lias, M.D.  ASSISTANT:  Dr. Conchita Paris.  CLINICAL HISTORY:  The patient in the past has fusion at the level of 4- 5 and 5-6 by me many years ago.  She had been doing really well, but lately, she had been complaining of neck pain radiation to the left shoulder.  This problem had been going on almost a year and she is not any better.  X-rays show severe stenosis at the level of C3-C4 centrally and laterally, left worse than right side.  She realized that she was no bending and the risks were explained to her and her husband.  DESCRIPTION OF PROCEDURE:  The patient was taken to the OR, and after intubation, the left side of the neck was cleaned with DuraPrep.  Then, drapes were applied, and transverse incision was made through the skin, subcutaneous tissue, straight to the cervical area.  We identified the previous surgery and x-ray showed that indeed we were at the level of C3- C4.  We opened the anterior ligament at the level of 3-4 and was started removing quite a bit of degenerative disk.  The disk was quite degenerative and soft.  With the help of the microscope, the total diskectomy was done.  We opened the posterior  ligament and decompression made on the right was the easy medial to the left.  The patient had a calcified disk attached to the dura mater and lysis was accomplished, and at the end having good decompression from the C3 nerve root on the left side.  The area was irrigated.  There was some fibroid pouch, but no evidence of any fluid coming through.  Investigation of the foramen showed it was completely opened and now, we have plenty of room for the both C3 nerve roots.  Then, the endplates were drilled and a cage of 7 mm height, lordotic was introduced.  Inside the cage, we have autograft and DBX.  This was followed by a plate with four screws.  The bone was loose, but we were able to infiltrate the four screws and secured them in place.  Then, the area was irrigated. We investigated for 5 minutes just to be sure we have any bleeding and then the wound was closed with Vicryl and Steri-Strips.          ______________________________ Hilda Lias, M.D.     EB/MEDQ  D:  08/07/2013  T:  08/08/2013  Job:  [id number]

## 2013-08-08 NOTE — Progress Notes (Signed)
UR completed 

## 2013-08-08 NOTE — Evaluation (Signed)
Occupational Therapy Evaluation Patient Details Name: Shelia Davis MRN: [id number] DOB: 1950/12/31 Today's Date: 08/08/2013 Time: 7829-5621 OT Time Calculation (min): 18 min  OT Assessment / Plan / Recommendation History of present illness patient complaining of neck pain with radiation to the left trapezius muscle, no better with conservative treatment,. In the past she has a two level fusion at [age].  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  Pt s/p anterior cervical decompression/discectomy at C3-4.   Clinical Impression   Patient is s/p ACDF C3-4 surgery resulting in functional limitations due to the deficits listed below (see OT problem list).  Patient will benefit from skilled OT acutely to increase independence and safety with ADLS to allow discharge home with (A). Pt very lethargic this session and evaluation limited. Pt is not safe to d/c home at this time.     OT Assessment  Patient needs continued OT Services    Follow Up Recommendations  No OT follow up    Barriers to Discharge      Equipment Recommendations  None recommended by OT    Recommendations for Other Services    Frequency  Min 2X/week    Precautions / Restrictions Precautions Precautions: Cervical Precaution Comments: cervical handout provided and reviewed with spouse   Pertinent Vitals/Pain No pain only "sickness' Pt very nauseated    ADL  Toilet Transfer: Minimal assistance Toilet Transfer Method: Stand pivot (x3 LOB posteriorly) Toilet Transfer Equipment: Raised toilet seat with arms (or 3-in-1 over toilet) Toileting - Clothing Manipulation and Hygiene: Min guard Where Assessed - Toileting Clothing Manipulation and Hygiene: Sit to stand from 3-in-1 or toilet Equipment Used: Gait belt Transfers/Ambulation Related to ADLs: Pt ambulated two steps to 3n1. Pt falling asleep on 3n1. pt needed cues to recall purpose of transfer. Pt needed verbal cue to void bladder to initiate task. Pt unsteady  with x3 lob ADL Comments: Pt completing bed mobility supervision level but very lethargic. Pt sitting EOB with eyes closed. Pt talking about information not relivant to task at hand. pt talking about "working on a farm" , medications she is allergic to, and asking about medications she has already taken. Pt with poor recall. Pt with poor arousal. Pt fall risk    OT Diagnosis: Generalized weakness;Cognitive deficits  OT Problem List: Decreased strength;Decreased activity tolerance;Impaired balance (sitting and/or standing);Decreased safety awareness;Decreased cognition;Decreased knowledge of use of DME or AE;Decreased knowledge of precautions;Pain;Obesity OT Treatment Interventions: Self-care/ADL training;Therapeutic exercise;DME and/or AE instruction;Therapeutic activities;Cognitive remediation/compensation;Patient/family education;Balance training   OT Goals(Current goals can be found in the care plan section) Acute Rehab OT Goals Patient Stated Goal: Feel better OT Goal Formulation: With patient/family Time For Goal Achievement: 08/22/13 Potential to Achieve Goals: Good  Visit Information  Last OT Received On: 08/08/13 Assistance Needed: +1 History of Present Illness: patient complaining of neck pain with radiation to the left trapezius muscle, no better with conservative treatment,. In the past she has a two level fusion at [age].  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  Pt s/p anterior cervical decompression/discectomy at C3-4.       Prior Functioning     Home Living Family/patient expects to be discharged to:: Private residence Living Arrangements: Spouse/significant other Available Help at Discharge: Family Type of Home: House Home Access: Stairs to enter Secretary/administrator of Steps: 5 Entrance Stairs-Rails: Can reach both Home Layout: One level Home Equipment: Bedside commode;Shower seat Additional Comments: Pt works at [employer]. Prior  Function Level  of Independence: Independent Communication Communication: Other (comment) (due to lethargy, pt difficult to understand) Dominant Hand: Right         Vision/Perception Vision - History Baseline Vision: Wears glasses all the time Patient Visual Report: No change from baseline   Cognition  Cognition Arousal/Alertness: Lethargic Behavior During Therapy: Flat affect Overall Cognitive Status: Impaired/Different from baseline Area of Impairment: Attention;Memory;Safety/judgement;Awareness Current Attention Level: Sustained Memory: Decreased short-term memory;Decreased recall of precautions Safety/Judgement: Decreased awareness of deficits Awareness: Anticipatory General Comments: Pt very lethargic during session. pt falling asleep and needing cues for arousal during session.  Difficult to assess due to: Level of arousal    Extremity/Trunk Assessment Upper Extremity Assessment Upper Extremity Assessment: Generalized weakness;RUE deficits/detail;LUE deficits/detail RUE Sensation: decreased light touch RUE Coordination: decreased fine motor;decreased gross motor LUE Sensation: decreased light touch LUE Coordination: decreased gross motor;decreased fine motor Lower Extremity Assessment Lower Extremity Assessment: Defer to PT evaluation Cervical / Trunk Assessment Cervical / Trunk Assessment: Other exceptions (surg)     Mobility Bed Mobility Bed Mobility: Supine to Sit;Sitting - Scoot to Edge of Bed;Sit to Supine Supine to Sit: 5: Supervision;With rails;HOB elevated Sitting - Scoot to Edge of Bed: 5: Supervision;With rail Sit to Supine: 5: Supervision;With rail Details for Bed Mobility Assistance: pt with excellent sequence and good recall from previous surg Transfers Transfers: Sit to Stand;Stand to Sit Sit to Stand: 4: Min guard;With upper extremity assist;From bed Stand to Sit: 4: Min assist;With upper extremity assist;To chair/3-in-1 Details for Transfer  Assistance: x3 LOB returning from 3n1 to bed level      Exercise     Balance     End of Session OT - End of Session Activity Tolerance: Patient limited by lethargy Patient left: in bed;with call bell/phone within reach;with family/visitor present Nurse Communication: Mobility status;Precautions  GO     Harolyn Rutherford 08/08/2013, 5:01 PM Pager: [phone]

## 2013-08-08 NOTE — Evaluation (Signed)
Physical Therapy Evaluation Patient Details Name: Shelia Davis MRN: [id number] DOB: 1951-05-07 Today's Date: 08/08/2013 Time: 4098-1191 PT Time Calculation (min): 17 min  PT Assessment / Plan / Recommendation History of Present Illness  patient complaining of neck pain with radiation to the left trapezius muscle, no better with conservative treatment,. In the past she has a two level fusion at [age].  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  Pt s/p anterior cervical decompression/discectomy at C3-4.  Clinical Impression  Pt lethargic during PT eval and demonstrating staggering gait pattern with moderate hand held assist.  Pt also became ill during gait, and vomited in hallway.  Anticipate pt progressing well once she is less lethargic.  Discussed with husband and he is in agreement.  Will follow pt acutely, but do not feel she will need PT post d/c.    PT Assessment  Patient needs continued PT services    Follow Up Recommendations  No PT follow up    Does the patient have the potential to tolerate intense rehabilitation      Barriers to Discharge        Equipment Recommendations  None recommended by PT    Recommendations for Other Services     Frequency Min 5X/week    Precautions / Restrictions Precautions Precautions: Cervical   Pertinent Vitals/Pain FACES 6/10      Mobility  Bed Mobility Bed Mobility: Supine to Sit Supine to Sit: HOB elevated;With rails;5: Supervision Transfers Transfers: Sit to Stand Sit to Stand: 5: Supervision Ambulation/Gait Ambulation/Gait Assistance: 3: Mod assist Ambulation Distance (Feet): 90 Feet Assistive device: 1 person hand held assist Ambulation/Gait Assistance Details: Pt with staggering gait and required MOD hand held assist plus occasional use of rail. Gait Pattern: Ataxic General Gait Details: Pt became ill during gait and began vomiting in hallway.    Exercises     PT Diagnosis: Difficulty walking;Acute pain  PT  Problem List: Decreased activity tolerance;Decreased balance;Decreased mobility PT Treatment Interventions: Gait training;Stair training;Functional mobility training;Therapeutic activities;Patient/family education     PT Goals(Current goals can be found in the care plan section) Acute Rehab PT Goals Patient Stated Goal: Feel better PT Goal Formulation: With patient Time For Goal Achievement: 08/22/13 Potential to Achieve Goals: Good  Visit Information  Last PT Received On: 08/08/13 Assistance Needed: +1 History of Present Illness: patient complaining of neck pain with radiation to the left trapezius muscle, no better with conservative treatment,. In the past she has a two level fusion at [age].  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  Pt s/p anterior cervical decompression/discectomy at C3-4.       Prior Functioning  Home Living Family/patient expects to be discharged to:: Private residence Living Arrangements: Spouse/significant other Available Help at Discharge: Family Type of Home: House Home Access: Stairs to enter Secretary/administrator of Steps: 5 Entrance Stairs-Rails: Can reach both Home Layout: One level Additional Comments: Pt works at [employer]. Prior Function Level of Independence: Independent Communication Communication: Other (comment) (due to lethargy, pt difficult to understand)    Cognition  Cognition Arousal/Alertness: Lethargic;Suspect due to medications Overall Cognitive Status: Difficult to assess Difficult to assess due to: Level of arousal    Extremity/Trunk Assessment Lower Extremity Assessment Lower Extremity Assessment: Overall WFL for tasks assessed   Balance Balance Balance Assessed: Yes Static Standing Balance Static Standing - Level of Assistance: 4: Min assist  End of Session PT - End of Session Equipment Utilized During Treatment: Gait belt  Activity Tolerance: Patient limited by lethargy;Treatment limited  secondary to medical complications (Comment) (Pt vomiting during gait and incontinent of urine.) Patient left: in bed;with nursing/sitter in room;with family/visitor present Nurse Communication: Mobility status  GP     Mitsuko Luera LUBECK 08/08/2013, 9:37 AM

## 2013-08-08 NOTE — Progress Notes (Signed)
Orthopedic Tech Progress Note Patient Details:  Shelia Davis June 30, 1951 [id number]  Ortho Devices Type of Ortho Device: Soft collar Ortho Device/Splint Interventions: Application   Shawnie Pons 08/08/2013, 12:30 PM

## 2013-08-09 LAB — URINE CULTURE
Colony Count: NO GROWTH
Culture: NO GROWTH

## 2013-08-09 NOTE — Progress Notes (Signed)
Physical Therapy Treatment Patient Details Name: Shelia Davis MRN: [id number] DOB: [birth year] Today's Date: 08/09/2013 Time: 0865-7846 PT Time Calculation (min): 31 min  PT Assessment / Plan / Recommendation  History of Present Illness H/o back fusion, now s/p anterior cervical decompression/discectomy at C3-C4   PT Comments   Pt continues to demonstrate significantly impaired balance with ambulation that was improved but not corrected with assistive device. Pt also demonstrates decreased safety awareness, attention, arousal, memory, and awareness of deficits which in addition to balance deficits place pt at very high risk for falls. Pt in tears with mention of attempting stairs (5 to enter home). Personally do not feel it is safe to attempt stairs with PT due to balance deficits and certainly do not feel safe for husband to try with pt today or tomorrow (also with recent back surgery). Spoke with nursing who also report pt is "all over the place" when trying to ambulate to bathroom. Do not feel pt ready to D/C home and recommend CIR to address deficits listed. WIll continue to follow acutely  Follow Up Recommendations  CIR     Does the patient have the potential to tolerate intense rehabilitation   Yes but she has concerns about intensity  Barriers to Discharge  Decreased caregiver support - husband reports he can take a week off      Equipment Recommendations   (defer to CIR)    Recommendations for Other Services Rehab consult  Frequency Min 5X/week   Progress towards PT Goals Progress towards PT goals: Progressing toward goals  Plan Discharge plan needs to be updated    Precautions / Restrictions Precautions Precautions: Cervical   Pertinent Vitals/Pain Faces 4/10 anterior neck, repositioned    Mobility  Bed Mobility Bed Mobility: Supine to Sit;Sitting - Scoot to Edge of Bed;Sit to Supine Supine to Sit: 5: Supervision;With rails;HOB elevated Sitting - Scoot to Edge of Bed: 5:  Supervision;With rail Sit to Supine: 5: Supervision;With rail Details for Bed Mobility Assistance: Good log roll but slow to initiate. Decreased awareness of positioning in bed, repeatedly repositions to same spot low in bed.  Transfers Transfers: Sit to Stand;Stand to Sit Sit to Stand: 4: Min guard;With upper extremity assist;From bed Stand to Sit: 4: Min assist;With upper extremity assist;To chair/3-in-1 Details for Transfer Assistance: Loss of balance with decreased balance reactions immediately upon standing. Pt felt ill with initial sitting, but did not vomit Ambulation/Gait Ambulation/Gait Assistance: 3: Mod assist Ambulation Distance (Feet): 90 Feet Assistive device: Rolling walker;1 person hand held assist Ambulation/Gait Assistance Details: Pt with very staggering gait, mod assist initially. With RW pt progressed to min/mod assist but has difficulty directing RW (swerves in multiple directions), and decreased safety with RW. Some cross over stepping noted Gait Pattern: Step-through pattern;Scissoring;Narrow base of support (uneven cadence) Stairs:  (unable to attempt)      PT Goals (current goals can now be found in the care plan section) Acute Rehab PT Goals Patient Stated Goal: Feel better  Visit Information  Last PT Received On: 08/09/13 Assistance Needed: +1 History of Present Illness: H/o back fusion, now s/p anterior cervical decompression/discectomy at C3-C4    Subjective Data  Patient Stated Goal: Feel better   Cognition  Cognition Arousal/Alertness: Lethargic Behavior During Therapy: Flat affect Overall Cognitive Status: Impaired/Different from baseline Area of Impairment: Attention;Memory;Safety/judgement;Awareness Current Attention Level: Sustained Memory: Decreased short-term memory;Decreased recall of precautions Safety/Judgement: Decreased awareness of deficits;Decreased awareness of safety Awareness: Anticipatory General Comments: Continues to be  lethargic. Pt  with very staggered gait and when RW retrieved pt reports "I'm doing good, I don't need that" Difficult to assess due to: Level of arousal    Balance  Balance Balance Assessed: Yes Static Standing Balance Static Standing - Level of Assistance: 4: Min assist Dynamic Standing Balance Dynamic Standing - Balance Support: Bilateral upper extremity supported;No upper extremity supported Dynamic Standing - Level of Assistance: 3: Mod assist;4: Min assist Dynamic Standing - Comments: Pt very imbalanced in standing, decreased balance reactions evident. Decreased safety awareness further increases risk for falls.   End of Session PT - End of Session Equipment Utilized During Treatment: Gait belt;Other (comment) (soft cervical collar for comfort) Activity Tolerance: Patient limited by lethargy Patient left: in bed;with family/visitor present Nurse Communication: Mobility status   GP     Wilhemina Bonito 08/09/2013, 9:33 AM

## 2013-08-09 NOTE — Consult Note (Signed)
Physical Medicine and Rehabilitation Consult  Reason for Consult: Cervical radiculopathy with balance deficits Referring Physician: Dr. Jeral Fruit   HPI: Shelia Davis is a 62 y.o. female with history of DDD with gait disorder and prior cervical fusion C 4-C6. 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 Jeral Fruit. Post op lethargy imporving but patient continues with poor safety awareness, unsteady gait as well as numbness in hands. MD, PT recommending CIR.  Had orthostatic episode just before my entry while trying to get to the toilet with RN and tech.   Review of Systems  Constitutional: Positive for malaise/fatigue.  Musculoskeletal: Positive for back pain, joint pain and neck pain.  All other systems reviewed and are negative.    Past Medical History  Diagnosis Date  . Osteoarthritis   . PONV (postoperative nausea and vomiting)    Past Surgical History  Procedure Laterality Date  . Cholecystectomy    . Abdominal hysterectomy    . Neck fusion    . Falls      numerous breaks from falls,wrist X3, fingers,lt leg,ankle,foot  . Fracture surgery      freq. falls   History reviewed. No pertinent family history.  Social History:  Married.  Per reports that she has never smoked. She does not have any smokeless tobacco history on file. Per reports that she does not drink alcohol or use illicit drugs.   Allergies  Allergen Reactions  . Phenergan [Promethazine Hcl]     Pt had past severe reaction     Severe  vomiting   Medications Prior to Admission  Medication Sig Dispense Refill  . alendronate (FOSAMAX) 40 MG tablet Take 40 mg by mouth every 7 (seven) days. Take with a full glass of water on an empty stomach.      . ergocalciferol (VITAMIN D2) 50000 UNITS capsule Take 50,000 Units by mouth once a week.      Marland Kitchen ibuprofen (ADVIL,MOTRIN) 100  MG tablet Take 200 mg by mouth every 6 (six) hours as needed for fever.      . loratadine (CLARITIN) 10 MG tablet Take 10 mg by mouth daily.      Marland Kitchen HYDROcodone-acetaminophen (NORCO/VICODIN) 5-325 MG per tablet Take 2 tablets by mouth every 4 (four) hours as needed for pain.  20 tablet  0  . ondansetron (ZOFRAN ODT) 4 MG disintegrating tablet Take 1 tablet (4 mg total) by mouth every 8 (eight) hours as needed for nausea.  12 tablet  0    Home: Home Living Family/patient expects to be discharged to:: Private residence Living Arrangements: Spouse/significant other Available Help at Discharge: Family Type of Home: House Home Access: Stairs to enter Secretary/administrator of Steps: 5 Entrance Stairs-Rails: Can reach both Home Layout: One level Home Equipment: Bedside commode;Shower seat Additional Comments: Pt works at [employer].  Functional History:   Functional Status:  Mobility: Bed Mobility Bed Mobility: Supine to Sit;Sitting - Scoot to Edge of Bed;Sit to Supine Supine to Sit: 5: Supervision;With rails;HOB elevated Sitting - Scoot to Edge of Bed: 5: Supervision;With rail Sit to Supine: 5: Supervision;With rail Transfers Transfers: Sit to Stand;Stand to Sit Sit to Stand: 4: Min guard;With upper extremity assist;From bed Stand to Sit: 4: Min assist;With upper extremity assist;To chair/3-in-1 Ambulation/Gait Ambulation/Gait Assistance: 3: Mod assist Ambulation Distance (Feet): 90 Feet Assistive device: Rolling walker;1 person  hand held assist Ambulation/Gait Assistance Details: Pt with very staggering gait, mod assist initially. With RW pt progressed to min/mod assist but has difficulty directing RW (swerves in multiple directions), and decreased safety with RW. Some cross over stepping noted Gait Pattern: Step-through pattern;Scissoring;Narrow base of support (uneven cadence) General Gait Details: Pt became ill during gait and began vomiting in hallway. Stairs:   (unable to attempt)    ADL: ADL Toilet Transfer: Minimal assistance Toilet Transfer Method: Stand pivot (x3 LOB posteriorly) Toilet Transfer Equipment: Raised toilet seat with arms (or 3-in-1 over toilet) Equipment Used: Gait belt Transfers/Ambulation Related to ADLs: Pt ambulated two steps to 3n1. Pt falling asleep on 3n1. pt needed cues to recall purpose of transfer. Pt needed verbal cue to void bladder to initiate task. Pt unsteady with x3 lob ADL Comments: Pt completing bed mobility supervision level but very lethargic. Pt sitting EOB with eyes closed. Pt talking about information not relivant to task at hand. pt talking about "working on a farm" , medications she is allergic to, and asking about medications she has already taken. Pt with poor recall. Pt with poor arousal. Pt fall risk  Cognition: Cognition Overall Cognitive Status: Impaired/Different from baseline Orientation Level: Oriented X4 Cognition Arousal/Alertness: Lethargic Behavior During Therapy: Flat affect Overall Cognitive Status: Impaired/Different from baseline Area of Impairment: Attention;Memory;Safety/judgement;Awareness Current Attention Level: Sustained Memory: Decreased short-term memory;Decreased recall of precautions Safety/Judgement: Decreased awareness of deficits;Decreased awareness of safety Awareness: Anticipatory General Comments: Continues to be lethargic. Pt with very staggered gait and when RW retrieved pt reports "I'm doing good, I don't need that" Difficult to assess due to: Level of arousal  Blood pressure 155/83, pulse 83, temperature 98.4 F (36.9 C), temperature source Oral, resp. rate 18, SpO2 92.00%. Physical Exam  Constitutional: She is oriented to person, place, and time. She appears well-developed and well-nourished. No distress.  Groggy, can't keep eyes open  HENT:  Head: Normocephalic and atraumatic.  Eyes: Conjunctivae and EOM are normal. Pupils are equal, round, and reactive to  light.  Neck: Normal range of motion. No JVD present. No tracheal deviation present. No thyromegaly present.  Cardiovascular: Normal rate.   Respiratory: Effort normal.  GI: Soft. She exhibits no distension.  Neurological: She is oriented to person, place, and time.  UE are 3/5 prox to 3+ distally. LE's are grossly 3 to 3+ out of 5 as well. Decreased PP and LT in arms and legs, but inconsistent. DTr's are diminished.  Good insight and awareness.     Results for orders placed during the hospital encounter of 08/07/13 (from the past 24 hour(s))  URINALYSIS, ROUTINE W REFLEX MICROSCOPIC     Status: Abnormal   Collection Time    08/08/13  9:19 PM      Result Value Range   Color, Urine YELLOW  YELLOW   APPearance CLEAR  CLEAR   Specific Gravity, Urine 1.004 (*) 1.005 - 1.030   pH 7.0  5.0 - 8.0   Glucose, UA NEGATIVE  NEGATIVE mg/dL   Hgb urine dipstick NEGATIVE  NEGATIVE   Bilirubin Urine NEGATIVE  NEGATIVE   Ketones, ur NEGATIVE  NEGATIVE mg/dL   Protein, ur NEGATIVE  NEGATIVE mg/dL   Urobilinogen, UA 0.2  0.0 - 1.0 mg/dL   Nitrite NEGATIVE  NEGATIVE   Leukocytes, UA NEGATIVE  NEGATIVE   Dg Cervical Spine 2-3 Views  08/07/2013   CLINICAL DATA:  62 year old female undergoing cervical spine surgery. Initial encounter.  EXAM: CERVICAL SPINE - 2-3 VIEW  COMPARISON:  None.  FINDINGS: Intraoperative portable cross-table lateral views of the cervical spine.  Film at 0834 hrs. Needle directed at the C3-C4 disc space. Underlying C4-C5 interbody fusion.  Film at 0943 hrs.  C3-C4 ACDF hardware in place.  IMPRESSION: Cephalad extension of cervical fusion to the C3-C4 level.   Electronically Signed   By: Augusto Gamble M.D.   On: 08/07/2013 10:34    Assessment/Plan: Diagnosis: cervical spondylosis with radiculopathy s/p C3 nerve root decompression and C3-4 ACDF. ?myelopathic 1. Does the need for close, 24 hr/day medical supervision in concert with the patient's rehab needs make it unreasonable for  this patient to be served in a less intensive setting? Yes 2. Co-Morbidities requiring supervision/potential complications: pain, lethargy, orthostasis 3. Due to bladder management, bowel management, safety, skin/wound care, disease management, medication administration, pain management and patient education, does the patient require 24 hr/day rehab nursing? Yes 4. Does the patient require coordinated care of a physician, rehab nurse, PT (1-2 hrs/day, 5 days/week) and OT (1-2 hrs/day, 5 days/week) to address physical and functional deficits in the context of the above medical diagnosis(es)? Yes Addressing deficits in the following areas: balance, endurance, locomotion, strength, transferring, bowel/bladder control, bathing, dressing, feeding, grooming, toileting and psychosocial support 5. Can the patient actively participate in an intensive therapy program of at least 3 hrs of therapy per day at least 5 days per week? Yes 6. The potential for patient to make measurable gains while on inpatient rehab is excellent 7. Anticipated functional outcomes upon discharge from inpatient rehab are mod I with PT, mod I to supervision with OT, n/a with SLP. 8. Estimated rehab length of stay to reach the above functional goals is: 8-10 days 9. Does the patient have adequate social supports to accommodate these discharge functional goals? Yes 10. Anticipated D/C setting: Home 11. Anticipated post D/C treatments: HH therapy 12. Overall Rehab/Functional Prognosis: excellent  RECOMMENDATIONS: This patient's condition is appropriate for continued rehabilitative care in the following setting: CIR Patient has agreed to participate in recommended program. Yes Note that insurance prior authorization may be required for reimbursement for recommended care.  Comment: Rehab RN to follow up.   Ranelle Oyster, MD, [place] [facility]       08/09/2013

## 2013-08-09 NOTE — Progress Notes (Signed)
Patient ID: Shelia Davis, female   DOB: 1951/06/20, 62 y.o.   MRN: [id number] Wound dry. Unstable while walking with some numbness in hands. Will get rehabilitation to see and possible transfer. Swallows better

## 2013-08-09 NOTE — Progress Notes (Signed)
Rehab Admissions Coordinator Note:  Patient was screened by Trish Mage for appropriateness for an Inpatient Acute Rehab Consult.  At this time, an inpatient rehab consult is pending completion today.  I will follow up once consult is complete.  Trish Mage 08/09/2013, 12:32 PM  I can be reached at [phone].

## 2013-08-09 NOTE — Progress Notes (Signed)
Occupational Therapy Treatment Patient Details Name: Shelia Davis MRN: [id number] DOB: 01-Dec-1950 Today's Date: 08/09/2013 Time: 4098-1191 OT Time Calculation (min): 23 min  OT Assessment / Plan / Recommendation  History of present illness H/o back fusion, now s/p anterior cervical decompression/discectomy at C3-C4   OT comments  Pt currently with balance deficits and unsafe to transfer without staff. Pt sitting on toilet and spouse standing at bathroom while OT obtained a new pad (due to incontinence of patient). Pt's husband turning to return to chair and LOB hitting wall on his Right side. Spouse states "oh I just twisted my knee a little, I am fine," Staff are reporting that family states they have a power chair at home but this was not obtained at evaluation by therapist. Pt with change in grasp/ decr fine motor and incontinence. RN and tech staff report changing patient 45 minutes prior to OT changing patient due to incontinence. Patient is unsafe to d/c home and if denied from CIR will need continued therapy at the highest level of care available. Pt will fall if d/c home with spouse   Follow Up Recommendations  CIR    Barriers to Discharge       Equipment Recommendations  Other (comment) (defer to next venues)    Recommendations for Other Services Rehab consult  Frequency Min 2X/week   Progress towards OT Goals Progress towards OT goals: Progressing toward goals  Plan Discharge plan needs to be updated    Precautions / Restrictions Precautions Precautions: Cervical Required Braces or Orthoses: Cervical Brace Cervical Brace: Soft collar;For comfort   Pertinent Vitals/Pain Tearful and states "my neck hurts"  Pt premedicated. Pt unable to maintain eyes open the entire session    ADL  Grooming: Minimal assistance;Teeth care;Wash/dry face Where Assessed - Grooming: Supported standing Upper Body Dressing: Maximal assistance (don brace ) Where Assessed - Upper Body Dressing:  Unsupported sitting Lower Body Dressing: Maximal assistance Where Assessed - Lower Body Dressing: Supported sit to Pharmacist, hospital: Minimal assistance Toilet Transfer Method: Sit to Barista: Regular height toilet Toileting - Clothing Manipulation and Hygiene: Maximal assistance Where Assessed - Toileting Clothing Manipulation and Hygiene: Sit to stand from 3-in-1 or toilet (unable feel toilet paper in her hands) Equipment Used: Gait belt;Rolling walker;Other (comment) (soft collar) Transfers/Ambulation Related to ADLs: Pt required v/c for hand placement and safety. Pt required use of RW due to unsteady static balance. pt ambulated to bathroom and needed max (A) to side step into bathroom. Pt needed cues for safety with RW. Pt completing toilet transfer then trying to turn with mesh panties still doff at knees. Pt needed max cues to stop and keep patient safe ADL Comments: Pt supine on arrival and agreeable to oral care. Pt supine in bed with noticable bruising at neck on left to center . Pt reports neck hurting with bed mobility but wanted to put soft collar on at eob. Pt with noticeable decr dexterity attempting to hold brace. Pt needed (A) to close brace. Pt sit<>stand at EOB with cues for hand placement. Pt ambulated to bathroom with sway unsteady gait even with RW. pt incontinent of bladder and saturated pad in mesh panties. Pt unable to tell if panties are wet or dry. Pt reports "I can't feel it" Pt mod (A) to doff panties and complete toilet transfer. pt needed extended time with little small voids of urine. pt placing pad in mesh panties and forgot to remove paper to make pad stick to  panties. Pt needed (A) To pull up mesh panties. Pt attempting to transfer and turn with mesh panties down incr risk of falling. pt needed cues to stop to prevent incr fall risk. Pt standing at sink for oral care. pt needed (A) to open tooth paste due to decr fine motor. Pt threading tooth  brush through fingers in an attempt to hold tooth brush. pt holding cup with bil UE. Pt needed max cues to use cup to split to keep from neck flexion. Pt with poor recall and carry over. pt static standing with LOB in all directions at sink. Pt with a sway to her inbalance. Pt return to supine position with spouse present. Pt and spouse educated that OT does not recommend d/c home at this time.    OT Diagnosis:    OT Problem List:   OT Treatment Interventions:     OT Goals(current goals can now be found in the care plan section) Acute Rehab OT Goals Patient Stated Goal: Feel better OT Goal Formulation: With patient/family Time For Goal Achievement: 08/22/13 Potential to Achieve Goals: Good ADL Goals Pt Will Perform Grooming: with modified independence;standing Pt Will Perform Upper Body Dressing: with modified independence;sitting Pt Will Perform Lower Body Dressing: with modified independence;sit to/from stand Pt Will Transfer to Toilet: with modified independence;bedside commode  Visit Information  Last OT Received On: 08/09/13 Assistance Needed: +1 History of Present Illness: H/o back fusion, now s/p anterior cervical decompression/discectomy at C3-C4    Subjective Data      Prior Functioning       Cognition  Cognition Arousal/Alertness: Lethargic Behavior During Therapy: Flat affect Overall Cognitive Status: Impaired/Different from baseline Area of Impairment: Memory;Safety/judgement Current Attention Level: Sustained Memory: Decreased short-term memory Safety/Judgement: Decreased awareness of deficits Awareness: Anticipatory General Comments: Continues to be lethargic. Pt with very staggered gait and when RW retrieved pt reports "I'm doing good, I don't need that" Difficult to assess due to: Level of arousal    Mobility  Bed Mobility Bed Mobility: Supine to Sit;Sitting - Scoot to Edge of Bed;Sit to Supine Supine to Sit: 4: Min assist;With rails;HOB elevated Sitting  - Scoot to Edge of Bed: 4: Min guard;With rail Sit to Supine: 4: Min guard;With rail;HOB elevated Details for Bed Mobility Assistance: pt requires use of bed rails. pt placing arm through the rail to use upper arm to help hold rail. Pt is unable to completely grasp rail without arm hooknig the rail Transfers Transfers: Sit to Stand;Stand to Sit Sit to Stand: 4: Min guard;With upper extremity assist;From bed Stand to Sit: 4: Min assist;With upper extremity assist;To toilet Details for Transfer Assistance: cues for safety and use of RW.    Exercises      Balance Balance Balance Assessed: Yes Static Standing Balance Static Standing - Level of Assistance: 4: Min assist Dynamic Standing Balance Dynamic Standing - Balance Support: Bilateral upper extremity supported;No upper extremity supported Dynamic Standing - Level of Assistance: 3: Mod assist;4: Min assist Dynamic Standing - Comments: Pt very imbalanced in standing, decreased balance reactions evident. Decreased safety awareness further increases risk for falls.  High Level Balance High Level Balance Activites: Direction changes;Turns   End of Session OT - End of Session Activity Tolerance: Patient limited by lethargy Patient left: in bed;with call bell/phone within reach;with family/visitor present Nurse Communication: Mobility status;Precautions  GO     Harolyn Rutherford 08/09/2013, 11:54 AM Pager: [phone]

## 2013-08-10 ENCOUNTER — Encounter (HOSPITAL_COMMUNITY): Payer: Self-pay | Admitting: Neurosurgery

## 2013-08-10 DIAGNOSIS — M5412 Radiculopathy, cervical region: Secondary | ICD-10-CM

## 2013-08-10 DIAGNOSIS — M47812 Spondylosis without myelopathy or radiculopathy, cervical region: Secondary | ICD-10-CM

## 2013-08-10 MED ORDER — FLEET ENEMA 7-19 GM/118ML RE ENEM
1.0000 | ENEMA | Freq: Every day | RECTAL | Status: DC | PRN
  Administered 2013-08-13: 1 via RECTAL
  Filled 2013-08-10: qty 1

## 2013-08-10 NOTE — Progress Notes (Signed)
Physical Therapy Treatment Patient Details Name: Shelia Davis MRN: [id number] DOB: 1951/09/15 Today's Date: 08/10/2013 Time: 7829-5621 PT Time Calculation (min): 36 min  PT Assessment / Plan / Recommendation  History of Present Illness H/o back fusion, now s/p anterior cervical decompression/discectomy at C3-C4   PT Comments   Pt continues to display balance disturbances and decreased awareness of safety or deficits. Pt with increased motivation to ambulate in the hallways and improve her balance, however pt became short of breath and stated her legs felt weak. She insisted on continuing and refused to turn back to the room, and therapist ended gait training. Upon sitting on EOB, symptoms resolved and pt was returned to supine. Husband reports she is improving, but hopes she will be admitted to inpatient rehab.  Follow Up Recommendations  CIR     Does the patient have the potential to tolerate intense rehabilitation     Barriers to Discharge        Equipment Recommendations  None recommended by PT    Recommendations for Other Services    Frequency Min 5X/week   Progress towards PT Goals Progress towards PT goals: Progressing toward goals  Plan Current plan remains appropriate    Precautions / Restrictions Precautions Precautions: Cervical Required Braces or Orthoses: Cervical Brace Cervical Brace: Soft collar;For comfort Restrictions Weight Bearing Restrictions: No   Pertinent Vitals/Pain Pt reports no pain throughout session.     Mobility  Bed Mobility Bed Mobility: Supine to Sit;Sit to Supine Supine to Sit: 4: Min guard;HOB elevated;With rails Sit to Supine: 4: Min guard;With rail;HOB flat Details for Bed Mobility Assistance: Heavy use of bed rails for support. Husband comments "this is the best she's done since she's been here." Transfers Transfers: Sit to Stand;Stand to Sit Sit to Stand: 4: Min guard;From toilet;From bed;With upper extremity assist Stand to Sit: 4:  Min guard;To toilet;To bed;With upper extremity assist Details for Transfer Assistance: Increased time to come to full stand but no physical assist required. Pt was able to control her descent to the commode and the bed with minimal hand use. Ambulation/Gait Ambulation/Gait Assistance: 4: Min assist Ambulation Distance (Feet): 200 Feet (x2 with rest break between attempts.) Assistive device: Rolling walker Ambulation/Gait Assistance Details: Pt pushing herself during session today to ambulate farther than previous sessions. Pt states she is weak and feels fatigued but insists to continue ambulating despite therapist's encouragement to return to room. Standing rest breaks required for SOB. Min assist for occasional LOB. Gait Pattern: Step-through pattern;Decreased stride length;Scissoring;Antalgic Gait velocity: Decreased Stairs: No    Exercises     PT Diagnosis:    PT Problem List:   PT Treatment Interventions:     PT Goals (current goals can now be found in the care plan section) Acute Rehab PT Goals Patient Stated Goal: Feel better PT Goal Formulation: With patient Time For Goal Achievement: 08/22/13 Potential to Achieve Goals: Good  Visit Information  Last PT Received On: 08/10/13 Assistance Needed: +1 History of Present Illness: H/o back fusion, now s/p anterior cervical decompression/discectomy at C3-C4    Subjective Data  Subjective: "I've been waiting for you." Patient Stated Goal: Feel better   Cognition  Cognition Arousal/Alertness: Lethargic Behavior During Therapy: Flat affect Overall Cognitive Status: Impaired/Different from baseline Area of Impairment: Memory;Safety/judgement Current Attention Level: Sustained Memory: Decreased short-term memory Safety/Judgement: Decreased awareness of deficits Awareness: Anticipatory General Comments: Pt continues to be lethargic and keeps eyes closed when sitting on EOB or on commode. Difficult to assess  due to: Level of  arousal    Balance  Balance Balance Assessed: Yes Static Sitting Balance Static Sitting - Balance Support: Feet supported;No upper extremity supported Static Sitting - Level of Assistance: 5: Stand by assistance Static Standing Balance Static Standing - Balance Support: Bilateral upper extremity supported Static Standing - Level of Assistance: 4: Min assist  End of Session PT - End of Session Equipment Utilized During Treatment: Gait belt;Cervical collar Activity Tolerance: Patient limited by fatigue Patient left: in bed;with family/visitor present Nurse Communication: Mobility status   GP     Ruthann Cancer 08/10/2013, 5:10 PM  Ruthann Cancer, PT, DPT [phone]

## 2013-08-10 NOTE — Progress Notes (Signed)
Patient ID: Shelia Davis, female   DOB: 10/10/1950, 62 y.o.   MRN: [id number] Sleepy, ambulating with help. Swallows better. Waiting for insurance green light to go to rehabilitation

## 2013-08-10 NOTE — Progress Notes (Signed)
INITIAL NUTRITION ASSESSMENT  DOCUMENTATION CODES Per approved criteria  -Obesity Unspecified   INTERVENTION: No nutrition intervention at this time --- patient declined RD to follow for nutrition care plan  NUTRITION DIAGNOSIS: Inadequate oral intake related to throat pain as evidenced by patient report, PO intake 40%  Goal: Pt to meet >/= 90% of their estimated nutrition needs   Monitor:  PO intake, weight, labs, I/O's  Reason for Assessment: Malnutrition Screening Tool Report  62 y.o. 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  Patient s/p procedure 11/19: ANTERIOR C3-C4 DISKECTOMY DECOMPRESSION OF SPINAL CORD REMOVAL OF LARGE CALCIFIED DISK AT LEFT C3 NERVE ROOT LYSIS OF ADHESIONS INTERBODY FUSION   Patient reports she only had her grits this morning.  Complaining of throat pain.  Declined addition of nutrition supplements.  PT recommending [facility].  Height: Ht Readings from Last 1 Encounters:  07/27/13 5' 5.5" (1.664 m)    Weight: Wt Readings from Last 1 Encounters:  07/27/13 215 lb 4 oz (97.637 kg)    Ideal Body Weight: 125 lb  % Ideal Body Weight: 172%  Wt Readings from Last 10 Encounters:  07/27/13 215 lb 4 oz (97.637 kg)    Usual Body Weight: ---  % Usual Body Weight: ---  BMI: 35.2 kg/m2  Estimated Nutritional Needs: Kcal: 1750-1950 Protein: 80-90 gm Fluid: 1.7-1.9 L  Skin: neck incision   Diet Order: General  EDUCATION NEEDS: -No education needs identified at this time  Scheduled Meds: . dexamethasone  4 mg Intravenous Q6H   Or  . dexamethasone  4 mg Oral Q6H  . loratadine  10 mg Oral Daily  . magic mouthwash  5 mL Oral QID    Continuous Infusions: . sodium chloride       Past Medical History  Diagnosis Date  . Osteoarthritis   . PONV (postoperative nausea and vomiting)     Past Surgical History  Procedure Laterality Date  . Cholecystectomy    . Abdominal hysterectomy    . Neck fusion    . Falls      numerous breaks from falls,wrist X3, fingers,lt leg,ankle,foot  . Fracture surgery      freq. falls    Maureen Chatters, RD, LDN Pager #: [phone] After-Hours Pager #: [phone]

## 2013-08-10 NOTE — Progress Notes (Signed)
Pt. Had a coughing fit after getting out of bed. The cough was deep and produced a small amount of blood tinged sputum. She was having difficulty calming herself down after getting up from the bed with 10/10 pain. Eventually she got back into bed and was able to relax. Will continue to monitor.

## 2013-08-11 MED ORDER — PHENOL 1.4 % MT LIQD: 1.0000 | OROMUCOSAL | Status: DC | PRN

## 2013-08-11 NOTE — Progress Notes (Signed)
Subjective: Patient reports starting to make progress  Objective: Vital signs in last 24 hours: Temp:  [98 F (36.7 C)-98.6 F (37 C)] 98.6 F (37 C) (11/22 0700) Pulse Rate:  [72-78] 75 (11/22 0700) Resp:  [18-20] 20 (11/22 0700) BP: (150-166)/(78-95) 166/78 mmHg (11/22 0700) SpO2:  [92 %-96 %] 96 % (11/22 0700)  Intake/Output from previous day:   Intake/Output this shift:    Physical Exam: Full strength with encouragement in upper extremities  Lab Results: No results found for this basename: WBC, HGB, HCT, PLT,  in the last 72 hours BMET No results found for this basename: NA, K, CL, CO2, GLUCOSE, BUN, CREATININE, CALCIUM,  in the last 72 hours  Studies/Results: No results found.  Assessment/Plan: Mobilizing slowly.  Awaiting Rehab bed.    LOS: 4 days    Dorian Heckle, MD 08/11/2013, 9:22 AM

## 2013-08-12 MED ORDER — SENNA 8.6 MG PO TABS
1.0000 | ORAL_TABLET | Freq: Two times a day (BID) | ORAL | Status: DC
  Administered 2013-08-12 – 2013-08-13 (×3): 8.6 mg via ORAL
  Filled 2013-08-12 (×3): qty 1

## 2013-08-12 MED ORDER — POLYETHYLENE GLYCOL 3350 17 G PO PACK
17.0000 g | PACK | Freq: Every day | ORAL | Status: DC
  Administered 2013-08-12 – 2013-08-13 (×2): 17 g via ORAL
  Filled 2013-08-12 (×2): qty 1

## 2013-08-12 NOTE — Progress Notes (Signed)
Pt ambulated about 200 feet on the unit with and gait belt. Tolerated fairly.

## 2013-08-12 NOTE — Progress Notes (Signed)
Overall unchanged. Still difficulty with pain and mobilization. Awaiting rehabilitation.  Afebrile. Vital stable. Exam stable.  Continue efforts at mobilization and therapy. Plan rehabilitation admission when bed available.

## 2013-08-13 ENCOUNTER — Inpatient Hospital Stay (HOSPITAL_COMMUNITY)
Admission: RE | Admit: 2013-08-13 | Discharge: 2013-08-23 | DRG: 945 | Disposition: A | Discharge status: Home / Self Care | Payer: 59 | Source: Intra-hospital | Attending: Physical Medicine & Rehabilitation | Admitting: Physical Medicine & Rehabilitation

## 2013-08-13 DIAGNOSIS — R5381 Other malaise: Secondary | ICD-10-CM | POA: Diagnosis present

## 2013-08-13 DIAGNOSIS — F3289 Other specified depressive episodes: Secondary | ICD-10-CM | POA: Diagnosis present

## 2013-08-13 DIAGNOSIS — M5412 Radiculopathy, cervical region: Secondary | ICD-10-CM | POA: Diagnosis present

## 2013-08-13 DIAGNOSIS — M4802 Spinal stenosis, cervical region: Secondary | ICD-10-CM | POA: Diagnosis present

## 2013-08-13 DIAGNOSIS — K5900 Constipation, unspecified: Secondary | ICD-10-CM | POA: Diagnosis present

## 2013-08-13 DIAGNOSIS — M4712 Other spondylosis with myelopathy, cervical region: Secondary | ICD-10-CM | POA: Diagnosis present

## 2013-08-13 DIAGNOSIS — R269 Unspecified abnormalities of gait and mobility: Secondary | ICD-10-CM | POA: Diagnosis present

## 2013-08-13 DIAGNOSIS — R209 Unspecified disturbances of skin sensation: Secondary | ICD-10-CM | POA: Diagnosis present

## 2013-08-13 DIAGNOSIS — Z5189 Encounter for other specified aftercare: Principal | ICD-10-CM

## 2013-08-13 DIAGNOSIS — D72829 Elevated white blood cell count, unspecified: Secondary | ICD-10-CM | POA: Diagnosis present

## 2013-08-13 DIAGNOSIS — F329 Major depressive disorder, single episode, unspecified: Secondary | ICD-10-CM | POA: Diagnosis present

## 2013-08-13 DIAGNOSIS — Z981 Arthrodesis status: Secondary | ICD-10-CM

## 2013-08-13 DIAGNOSIS — M5030 Other cervical disc degeneration, unspecified cervical region: Secondary | ICD-10-CM | POA: Diagnosis present

## 2013-08-13 DIAGNOSIS — F0390 Unspecified dementia without behavioral disturbance: Secondary | ICD-10-CM | POA: Diagnosis present

## 2013-08-13 LAB — URINALYSIS, ROUTINE W REFLEX MICROSCOPIC
Bilirubin Urine: NEGATIVE
Glucose, UA: NEGATIVE mg/dL
Hgb urine dipstick: NEGATIVE
Leukocytes, UA: NEGATIVE
Specific Gravity, Urine: 1.015 (ref 1.005–1.030)
Urobilinogen, UA: 0.2 mg/dL (ref 0.0–1.0)

## 2013-08-13 MED ORDER — SENNA 8.6 MG PO TABS
2.0000 | ORAL_TABLET | Freq: Two times a day (BID) | ORAL | Status: DC
  Administered 2013-08-13 – 2013-08-23 (×20): 17.2 mg via ORAL
  Filled 2013-08-13 (×24): qty 2

## 2013-08-13 MED ORDER — MAGIC MOUTHWASH
5.0000 mL | Freq: Four times a day (QID) | ORAL | Status: DC
  Administered 2013-08-13 – 2013-08-23 (×37): 5 mL via ORAL
  Filled 2013-08-13 (×42): qty 5

## 2013-08-13 MED ORDER — FLEET ENEMA 7-19 GM/118ML RE ENEM
1.0000 | ENEMA | Freq: Every day | RECTAL | Status: DC | PRN
  Administered 2013-08-14: 1 via RECTAL
  Filled 2013-08-13 (×2): qty 1

## 2013-08-13 MED ORDER — FAMOTIDINE 20 MG PO TABS: 20.0000 mg | ORAL_TABLET | Freq: Two times a day (BID) | ORAL | Status: DC

## 2013-08-13 MED ORDER — ONDANSETRON HCL 4 MG/2ML IJ SOLN: 4.0000 mg | INTRAMUSCULAR | Status: DC | PRN

## 2013-08-13 MED ORDER — POLYETHYLENE GLYCOL 3350 17 G PO PACK: 17.0000 g | PACK | Freq: Every day | ORAL | Status: DC

## 2013-08-13 MED ORDER — MENTHOL 3 MG MT LOZG
1.0000 | LOZENGE | OROMUCOSAL | Status: DC | PRN
  Filled 2013-08-13: qty 9

## 2013-08-13 MED ORDER — FAMOTIDINE 20 MG PO TABS
20.0000 mg | ORAL_TABLET | Freq: Two times a day (BID) | ORAL | Status: DC
  Administered 2013-08-13 – 2013-08-23 (×20): 20 mg via ORAL
  Filled 2013-08-13 (×22): qty 1

## 2013-08-13 MED ORDER — ONDANSETRON 4 MG PO TBDP: 4.0000 mg | ORAL_TABLET | Freq: Three times a day (TID) | ORAL | Status: DC | PRN

## 2013-08-13 MED ORDER — DEXAMETHASONE 4 MG PO TABS
4.0000 mg | ORAL_TABLET | Freq: Four times a day (QID) | ORAL | Status: DC
  Administered 2013-08-13 – 2013-08-14 (×4): 4 mg via ORAL
  Filled 2013-08-13 (×7): qty 1

## 2013-08-13 MED ORDER — BISACODYL 10 MG RE SUPP
10.0000 mg | Freq: Every day | RECTAL | Status: DC | PRN
  Administered 2013-08-14: 10 mg via RECTAL
  Filled 2013-08-13: qty 1

## 2013-08-13 MED ORDER — LORATADINE 10 MG PO TABS
10.0000 mg | ORAL_TABLET | Freq: Every day | ORAL | Status: DC
  Administered 2013-08-14 – 2013-08-23 (×10): 10 mg via ORAL
  Filled 2013-08-13 (×12): qty 1

## 2013-08-13 MED ORDER — ONDANSETRON 4 MG PO TBDP
4.0000 mg | ORAL_TABLET | Freq: Three times a day (TID) | ORAL | Status: DC | PRN
  Filled 2013-08-13: qty 1

## 2013-08-13 MED ORDER — PHENOL 1.4 % MT LIQD: 1.0000 | OROMUCOSAL | Status: DC | PRN

## 2013-08-13 MED ORDER — METHOCARBAMOL 500 MG PO TABS
500.0000 mg | ORAL_TABLET | Freq: Four times a day (QID) | ORAL | Status: DC | PRN
  Administered 2013-08-15: 750 mg via ORAL
  Administered 2013-08-15 – 2013-08-17 (×3): 500 mg via ORAL
  Administered 2013-08-18 – 2013-08-21 (×4): 750 mg via ORAL
  Administered 2013-08-21: 500 mg via ORAL
  Filled 2013-08-13 (×3): qty 2
  Filled 2013-08-13 (×3): qty 1
  Filled 2013-08-13 (×2): qty 2
  Filled 2013-08-13: qty 1

## 2013-08-13 MED ORDER — OXYCODONE-ACETAMINOPHEN 5-325 MG PO TABS
1.0000 | ORAL_TABLET | ORAL | Status: DC | PRN
  Administered 2013-08-13: 2 via ORAL
  Administered 2013-08-14: 1 via ORAL
  Administered 2013-08-14: 2 via ORAL
  Administered 2013-08-14 – 2013-08-15 (×3): 1 via ORAL
  Filled 2013-08-13: qty 1
  Filled 2013-08-13: qty 2
  Filled 2013-08-13: qty 1
  Filled 2013-08-13: qty 2
  Filled 2013-08-13 (×2): qty 1

## 2013-08-13 MED ORDER — SENNA 8.6 MG PO TABS: 1.0000 | ORAL_TABLET | Freq: Two times a day (BID) | ORAL | Status: AC

## 2013-08-13 MED ORDER — OXYCODONE-ACETAMINOPHEN 5-325 MG PO TABS: 1.0000 | ORAL_TABLET | ORAL | Status: DC | PRN

## 2013-08-13 MED ORDER — PHENOL 1.4 % MT LIQD
1.0000 | OROMUCOSAL | Status: DC | PRN
  Filled 2013-08-13: qty 177

## 2013-08-13 NOTE — Progress Notes (Addendum)
Rehab admissions - Evaluated for possible admission.  I met with patient, gave her rehab brochures and answered her questions.  I have spoken with the [facility] on site reviewer to request acute inpatient rehab admission.  I have called [employer] and have opened the case.  Will await call back from [facility] case manager.  Call me for questions.  #[phone]  I have approval from [facility] on site reviewer.  Bed available and will admit to acute inpatient rehab today.  Call me for questions.  #[phone]

## 2013-08-13 NOTE — PMR Pre-admission (Signed)
PMR Admission Coordinator Pre-Admission Assessment  Patient: Shelia Davis is an 62 y.o., female MRN: [id number] DOB: Dec 03, 1950 Height: 5' 5.5" (166.4 cm) Weight: 97.977 kg (216 lb)              Insurance Information HMO:     PPO:       PCP:       IPA:       80/20:       OTHER:  [employer] PRIMARY: [employer]      Policy#: [id number]      Subscriber: [name] CM Name: Pennelope Bracken      Phone#: [phone]     Fax#:  Pre-Cert#: [id number]      Employer: [employer] Benefits:  Phone #: [phone]     Name: [name]. Date: 09/21/11     Deduct: $0      Out of Pocket Max: $5000(met $845.06)      Life Max: unlimited CIR: 80% w/auth      SNF: 80% w/auth  100 days Outpatient: 80%     Co-Pay: 20% Home Health: 100% X 15 visits, 80% 16-100 visits      Co-Pay: 20% DME: 80%     Co-Pay: 20% Providers: in network  SECONDARY: [place] of [place]      Policy#: [id number]      Subscriber: [name] CM Name:        Phone#:       Fax#:   Pre-Cert#: No auth required       Employer: Spouse works FT Benefits:  Phone #: [phone]     Name:   Eff. 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 female with history of DDD with gait disorder and prior cervical fusion C 4-C6. 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 Jeral Fruit. Post op lethargy imporving but patient continues with poor safety awareness, unsteady gait as well as numbness in hands. MD, PT recommending CIR. Had  orthostatic episode just before Dr. Riley Kill completed rehab consult while trying to get to the toilet with RN and tech.    Past Medical History  Past Medical History  Diagnosis Date  . Osteoarthritis   . PONV (postoperative nausea and vomiting)     Family History  family history is not on file.  Prior Rehab/Hospitalizations: Had outpatient therapy 20 yrs ago after neck surgery   Current Medications  Current facility-administered medications:0.9 %  sodium chloride infusion, , Intravenous, Continuous, [name], MD;  acetaminophen (TYLENOL) suppository 650 mg, 650 mg, Rectal, Q4H PRN, [name], MD;  acetaminophen (TYLENOL) tablet 650 mg, 650 mg, Oral, Q4H PRN, [name], MD;  dexamethasone (DECADRON) injection 4 mg, 4 mg, Intravenous, Q6H, [name], MD,  4 mg at 08/10/13 1749 dexamethasone (DECADRON) tablet 4 mg, 4 mg, Oral, Q6H, [name], MD, 4 mg at 08/13/13 0534;  diazepam (VALIUM) tablet 5 mg, 5 mg, Oral, Q6H PRN, [name], MD, 5 mg at 08/12/13 2255;  loratadine (CLARITIN) tablet 10 mg, 10 mg, Oral, Daily, [name], MD, 10 mg at 08/13/13 1047;  magic mouthwash, 5 mL, Oral, QID, [name], MD, 5 mL at 08/13/13 0853 menthol-cetylpyridinium (CEPACOL) lozenge 3 mg, 1 lozenge, Oral, PRN, [name], MD;  ondansetron [facility]) injection 4 mg, 4 mg, Intravenous, Q4H PRN, [name], MD, 4 mg at 08/08/13 0935;  ondansetron (ZOFRAN-ODT) disintegrating tablet 4 mg, 4 mg, Oral, Q8H PRN, [name], MD, 4 mg at 08/09/13 1610 oxyCODONE-acetaminophen (PERCOCET/ROXICET) 5-325 MG per tablet 1-2 tablet, 1-2 tablet, Oral, Q4H PRN, [name], MD, 1 tablet at 08/13/13 0534;  phenol (CHLORASEPTIC) mouth spray 1 spray, 1 spray, Mouth/Throat, PRN, [name], MD, 1 spray at 08/13/13 0552;  phenol (CHLORASEPTIC) mouth spray 1 spray, 1 spray, Mouth/Throat, PRN, [name], MD polyethylene glycol (MIRALAX / GLYCOLAX) packet 17 g,  17 g, Oral, Daily, [name], MD, 17 g at 08/13/13 1047;  senna (SENOKOT) tablet 8.6 mg, 1 tablet, Oral, BID, [name], MD, 8.6 mg at 08/13/13 1047;  sodium phosphate (FLEET) 7-19 GM/118ML enema 1 enema, 1 enema, Rectal, Daily PRN, [name], MD, 1 enema at 08/13/13 9604  Patients Current Diet: General  Precautions / Restrictions Precautions Precautions: Cervical Precaution Comments: cervical handout provided and reviewed with spouse Cervical Brace: Soft collar;For comfort Restrictions Weight Bearing Restrictions: No   Prior Activity Level Community (5-7x/wk): Worked FT.  Went out daily.  Home Assistive Devices / Equipment Home Assistive Devices/Equipment: None Home Equipment: Bedside commode;Shower seat  Prior Functional Level Prior Function Level of Independence: Independent  Current Functional Level Cognition  Overall Cognitive Status: Impaired/Different from baseline Difficult to assess due to: Level of arousal Current Attention Level: Sustained Orientation Level: Oriented X4 Safety/Judgement: Decreased awareness of deficits;Decreased awareness of safety General Comments: pt requires cues for hand placement and safety using RW. Pt attempting to sit without reaching for surface. Pt attempting to sit at sink 1 foot away from 3n1 surface. OT had to move 3n1 quickly to prevent fall    Extremity Assessment (includes Sensation/Coordination)          ADLs  Grooming: Minimal assistance Where Assessed - Grooming: Unsupported standing (needs built up foam to hold tooth brush. Dropping items) Upper Body Bathing: Chest;Right arm;Left arm;Abdomen;Minimal assistance Where Assessed - Upper Body Bathing: Unsupported sit to stand (LOB with self correction several times) Lower Body Bathing: Minimal assistance Where Assessed - Lower Body Bathing: Supported sitting (sitting on 3n1 for LB and dropping items) Upper Body Dressing: Minimal assistance Where Assessed -  Upper Body Dressing: Supported sitting Lower Body Dressing: +1 Total assistance Where Assessed - Lower Body Dressing: Supported sitting (don socks total / MAX (A) to doff socks and underwear) Toilet Transfer: Minimal assistance Toilet Transfer Method: Sit to stand (cues for hand placement) Toilet Transfer Equipment: Raised toilet seat with arms (or 3-in-1 over toilet) Toileting - Clothing Manipulation and Hygiene: Minimal assistance Where Assessed - Toileting Clothing Manipulation and Hygiene: Sit to stand from 3-in-1 or toilet Equipment Used: Rolling walker Transfers/Ambulation Related to ADLs: Pt transferring with RW. pt unsteady with RW use during session. Pt reports "i am not steady" Pt required > 4 steps to turn with  RW. pt with loose grasp with LT UE on RW. pt unable to navigate narrowed space without (A) RW ADL Comments: Pt sitting on EOB on arrival. Pt ambulated to bathroom to complete full ADL and grooming. Pt dropping adl items throughout session. Pt required rest breaks sitting on 3n1 for balance and fatigue. pt with Rt UE able to supinate/ pronate to grab items. Pt with LT UE apraxic movement. Pt reports "my hands feel like cardboard" Pt with changes in sensation. Pt placing wash cloth on dorsal aspect of hand to increase surface area to attempt to apply soap with Rt UE. pt undershooting. Pt required BIL UE to attempt to pick up all items. Pt unable to open deordorant round screw top. Pt required blue foam added to tooth brush. Pt using bil UE moving head to brush teeth. pt educated on using electric tooth brush to clean teeth. Pt needed prolonged time to complete full bath, brush teeth and comb hair. pt is incontinent of bladder and requires mesh panties with pad. Pt with unable to pull up mesh panties without (A).     Mobility  Bed Mobility: Not assessed Supine to Sit: 4: Min guard;HOB elevated;With rails Sitting - Scoot to Edge of Bed: 4: Min guard;With rail Sit to Supine: 4: Min  guard;With rail;HOB flat    Transfers  Transfers: Sit to Stand;Stand to Sit Sit to Stand: 4: Min guard;Without upper extremity assist;From chair/3-in-1 Stand to Sit: 4: Min guard;Without upper extremity assist;To chair/3-in-1    Ambulation / Gait / Stairs / Wheelchair Mobility  Ambulation/Gait Ambulation/Gait Assistance: 4: Min Environmental consultant (Feet): 200 Feet (x2 with rest break between attempts.) Assistive device: Rolling walker Ambulation/Gait Assistance Details: Pt pushing herself during session today to ambulate farther than previous sessions. Pt states she is weak and feels fatigued but insists to continue ambulating despite therapist's encouragement to return to room. Standing rest breaks required for SOB. Min assist for occasional LOB. Gait Pattern: Step-through pattern;Decreased stride length;Scissoring;Antalgic Gait velocity: Decreased General Gait Details: Pt became ill during gait and began vomiting in hallway. Stairs: No    Posture / Balance Static Sitting Balance Static Sitting - Balance Support: Feet supported;No upper extremity supported Static Sitting - Level of Assistance: 5: Stand by assistance Static Standing Balance Static Standing - Balance Support: Bilateral upper extremity supported Static Standing - Level of Assistance: 4: Min assist Dynamic Standing Balance Dynamic Standing - Balance Support: Bilateral upper extremity supported;No upper extremity supported Dynamic Standing - Level of Assistance: 3: Mod assist;4: Min assist Dynamic Standing - Comments: Pt very imbalanced in standing, decreased balance reactions evident. Decreased safety awareness further increases risk for falls.  High Level Balance High Level Balance Activites: Direction changes;Turns    Special needs/care consideration BiPAP/CPAP No CPM No Continuous Drip IV 0.9% NS 75 ml/hr Dialysis No      Life Vest No Oxygen No Special Bed No Trach Size No Wound Vac (area) No      Skin  Has a soft neck collar over anterior neck incision                              Bowel mgmt: Had BM for first time 08/13/13 Bladder mgmt: Voiding incontinently at times Diabetic mgmt No    Previous Home Environment Living Arrangements: Spouse/significant other Available Help at Discharge: Family Type of Home: House Home Layout: One level Home Access: Stairs to enter Entrance Stairs-Rails: Can reach both Entrance  Stairs-Number of Steps: 5 Bathroom Shower/Tub: Walk-in Contractor: Standard Home Care Services: No Additional Comments: Pt works at [employer].  Discharge Living Setting Plans for Discharge Living Setting: Patient's home;House;Lives with (comment) (Lives with husband.) Type of Home at Discharge: House Discharge Home Layout: One level Discharge Home Access: Stairs to enter Entrance Stairs-Number of Steps: 5 Does the patient have any problems obtaining your medications?: No  Social/Family/Support Systems Patient Roles: Spouse;Parent (Has 1 daughter and 2 sons locally.) Contact Information: Shawnise Peterkin - husband (H) [phone] (c) [phone] Anticipated Caregiver: self and husband at evenings Ability/Limitations of Caregiver: Husband works days 6 am to 5 pm (Sons work.  Dtr cares for patient's father.) Caregiver Availability: Evenings only Discharge Plan Discussed with Primary Caregiver: Yes Is Caregiver In Agreement with Plan?: Yes Does Caregiver/Family have Issues with Lodging/Transportation while Pt is in Rehab?: No  Goals/Additional Needs Patient/Family Goal for Rehab: PT/OT mod I goals, no ST needs Expected length of stay: 8-10 days Cultural Considerations: [employer] Dietary Needs: Regular, thin liquids Equipment Needs: TBD Pt/Family Agrees to Admission and willing to participate: Yes Program Orientation Provided & Reviewed with Pt/Caregiver Including Roles  & Responsibilities: Yes   Decrease burden of Care through IP rehab  admission:  N/A  Possible need for SNF placement upon discharge: Not planned   Patient Condition: This patient's medical and functional status has changed since the consult dated: 08/10/13 in which the Rehabilitation Physician determined and documented that the patient's condition is appropriate for intensive rehabilitative care in an inpatient rehabilitation facility. See "History of Present Illness" (above) for medical update. Functional changes are: Currently ambulating 200 ft min assist RQ with rest break.  Hand grips are weak, having difficulty with completing ADLS in a timely manner. Patient's medical and functional status update has been discussed with the Rehabilitation physician and patient remains appropriate for inpatient rehabilitation. Will admit to inpatient rehab today.  Preadmission Screen Completed By:  Trish Mage, 08/13/2013 11:21 AM ______________________________________________________________________   Discussed status with Dr. Wynn Banker on 08/13/13 at 1138 and received telephone approval for admission today.  Admission Coordinator:  Trish Mage, time1138/Date11/24/14

## 2013-08-13 NOTE — Progress Notes (Signed)
Occupational Therapy Treatment Patient Details Name: Shelia Davis MRN: [id number] DOB: 17-Feb-1951 Today's Date: 08/13/2013 Time: 1478-2956 OT Time Calculation (min): 46 min  OT Assessment / Plan / Recommendation  History of present illness H/o back fusion, now s/p anterior cervical decompression/discectomy at C3-C4   OT comments  Pt progressing this session however remains with BIL UE decr sensation and apraxic movements. Pt requires use of built up foam handles to allow patient to hold objects with BIL UE at same time. Pt PTA was able to complete fine motor task. Pt with incontinence of bladder since surg and requires pad/ mesh panties. Pt with balance deficits requiring use of RW at this time. Pt with frequent rest breaks sitting during this session.  Follow Up Recommendations  CIR    Barriers to Discharge       Equipment Recommendations  Other (comment) (RW)    Recommendations for Other Services Rehab consult  Frequency Min 2X/week   Progress towards OT Goals Progress towards OT goals: Progressing toward goals  Plan Discharge plan remains appropriate    Precautions / Restrictions Precautions Precautions: Cervical Required Braces or Orthoses: Cervical Brace Cervical Brace: Soft collar;For comfort   Pertinent Vitals/Pain "My hands feel like cardboard"    ADL  Grooming: Minimal assistance Where Assessed - Grooming: Unsupported standing (needs built up foam to hold tooth brush. Dropping items) Upper Body Bathing: Chest;Right arm;Left arm;Abdomen;Minimal assistance Where Assessed - Upper Body Bathing: Unsupported sit to stand (LOB with self correction several times) Lower Body Bathing: Minimal assistance Where Assessed - Lower Body Bathing: Supported sitting (sitting on 3n1 for LB and dropping items) Upper Body Dressing: Minimal assistance Where Assessed - Upper Body Dressing: Supported sitting Lower Body Dressing: +1 Total assistance Where Assessed - Lower Body Dressing:  Supported sitting (don socks total / MAX (A) to doff socks and underwear) Toilet Transfer: Minimal assistance Toilet Transfer Method: Sit to stand (cues for hand placement) Toilet Transfer Equipment: Raised toilet seat with arms (or 3-in-1 over toilet) Toileting - Clothing Manipulation and Hygiene: Minimal assistance Where Assessed - Toileting Clothing Manipulation and Hygiene: Sit to stand from 3-in-1 or toilet Equipment Used: Rolling walker Transfers/Ambulation Related to ADLs: Pt transferring with RW. pt unsteady with RW use during session. Pt reports "i am not steady" Pt required > 4 steps to turn with RW. pt with loose grasp with LT UE on RW. pt unable to navigate narrowed space without (A) RW ADL Comments: Pt sitting on EOB on arrival. Pt ambulated to bathroom to complete full ADL and grooming. Pt dropping adl items throughout session. Pt required rest breaks sitting on 3n1 for balance and fatigue. pt with Rt UE able to supinate/ pronate to grab items. Pt with LT UE apraxic movement. Pt reports "my hands feel like cardboard" Pt with changes in sensation. Pt placing wash cloth on dorsal aspect of hand to increase surface area to attempt to apply soap with Rt UE. pt undershooting. Pt required BIL UE to attempt to pick up all items. Pt unable to open deordorant round screw top. Pt required blue foam added to tooth brush. Pt using bil UE moving head to brush teeth. pt educated on using electric tooth brush to clean teeth. Pt needed prolonged time to complete full bath, brush teeth and comb hair. pt is incontinent of bladder and requires mesh panties with pad. Pt with unable to pull up mesh panties without (A).     OT Diagnosis:    OT Problem List:  OT Treatment Interventions:     OT Goals(current goals can now be found in the care plan section) Acute Rehab OT Goals Patient Stated Goal: Feel better OT Goal Formulation: With patient/family Time For Goal Achievement: 08/22/13 Potential to Achieve  Goals: Good ADL Goals Pt Will Perform Grooming: with modified independence;standing Pt Will Perform Upper Body Dressing: with modified independence;sitting Pt Will Perform Lower Body Dressing: with modified independence;sit to/from stand Pt Will Transfer to Toilet: with modified independence;bedside commode  Visit Information  Last OT Received On: 08/13/13 Assistance Needed: +1 History of Present Illness: H/o back fusion, now s/p anterior cervical decompression/discectomy at C3-C4    Subjective Data      Prior Functioning       Cognition  Cognition Arousal/Alertness: Awake/alert Behavior During Therapy: Flat affect Overall Cognitive Status: Impaired/Different from baseline Area of Impairment: Safety/judgement;Awareness Safety/Judgement: Decreased awareness of deficits;Decreased awareness of safety Awareness: Anticipatory General Comments: pt requires cues for hand placement and safety using RW. Pt attempting to sit without reaching for surface. Pt attempting to sit at sink 1 foot away from 3n1 surface. OT had to move 3n1 quickly to prevent fall    Mobility  Bed Mobility Bed Mobility: Not assessed Transfers Transfers: Sit to Stand;Stand to Sit Sit to Stand: 4: Min guard;Without upper extremity assist;From chair/3-in-1 Stand to Sit: 4: Min guard;Without upper extremity assist;To chair/3-in-1 Details for Transfer Assistance: pt requires use of BIL UE from arm rest to complete stand. Pt with poor hand placement for safety    Exercises      Balance     End of Session OT - End of Session Activity Tolerance: Patient tolerated treatment well Patient left: in chair;with call bell/phone within reach Nurse Communication: Mobility status;Precautions  GO     Harolyn Rutherford 08/13/2013, 9:57 AM Pager: [phone]

## 2013-08-13 NOTE — Progress Notes (Signed)
Seen and agreed 08/13/2013 Robinette, Julia Elizabeth PTA [phone] pager [phone] office    

## 2013-08-13 NOTE — Progress Notes (Signed)
Patient ID: Shelia Davis, female   DOB: May 21, 1951, 62 y.o.   MRN: [id number] To be ytransferred to rehabilitation

## 2013-08-13 NOTE — Progress Notes (Signed)
Patient ID: Shelia Davis, female   DOB: [birth year], 62 y.o.   MRN: [id number] Stable, ambulating with support. Swallows better. Insurance to ok transfer to the rehabilitation unit. Wound dry

## 2013-08-13 NOTE — H&P (Signed)
Physical Medicine and Rehabilitation Admission H&P      CC: Cervical radiculopathy with balance deficits   HPI:  Shelia Davis is a 62 y.o. female with history of DDD with gait disorder and prior cervical fusion C 4-C6. 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 Jeral Fruit. Post op lethargy imporving but patient continues with poor safety awareness, unsteady gait as well as numbness in hands. She had syncopal episode past surgery and BP stabilizing. On steroid taper currently. MD, PT recommending CIR.   Patient complaining of hand pain and numbness and tingling bilateral as well as weakness  Review of Systems  Constitutional: Negative.   Eyes: Negative.   Respiratory: Negative.   Cardiovascular: Negative.   Gastrointestinal: Positive for constipation.  Genitourinary: Negative for dysuria.  Musculoskeletal: Positive for neck pain.  Skin: Negative.   Neurological: Positive for sensory change.  Psychiatric/Behavioral: Negative.       Past Medical History   Diagnosis  Date   .  Osteoarthritis     .  PONV (postoperative nausea and vomiting)      Past Surgical History   Procedure  Laterality  Date   .  Cholecystectomy       .  Abdominal hysterectomy       .  Neck fusion       .  Falls           numerous breaks from falls,wrist X3, fingers,lt leg,ankle,foot   .  Fracture surgery           freq. falls   .  Anterior cervical decomp/discectomy fusion  N/A  08/07/2013       Procedure: CERVICAL THREE-FOUR ANTERIOR CERVICAL DECOMPRESSION/DISCECTOMY FUSION 1 LEVEL;  Surgeon: [name], MD;  Location: [facility];  Service: Neurosurgery;  Laterality: N/A;  C3-4 Anterior cervical decompression/diskectomy/fusion    History reviewed. No pertinent family history.   Social History: reports that she has never smoked. She does not have any  smokeless tobacco history on file. She reports that she does not drink alcohol or use illicit drugs.      Allergies   Allergen  Reactions   .  Phenergan [Promethazine Hcl]         Pt had past severe reaction     Severe  vomiting       Medications Prior to Admission   Medication  Sig  Dispense  Refill   .  alendronate (FOSAMAX) 40 MG tablet  Take 40 mg by mouth every 7 (seven) days. Take with a full glass of water on an empty stomach.         .  ergocalciferol (VITAMIN D2) 50000 UNITS capsule  Take 50,000 Units by mouth once a week.         Marland Kitchen  ibuprofen (ADVIL,MOTRIN) 100 MG tablet  Take 200 mg by mouth every 6 (six) hours as needed for fever.         .  loratadine (CLARITIN) 10 MG tablet  Take 10 mg by mouth daily.         Marland Kitchen  HYDROcodone-acetaminophen (NORCO/VICODIN) 5-325 MG per tablet  Take 2 tablets by mouth every 4 (four) hours as needed for pain.   20 tablet   0   .  ondansetron (ZOFRAN ODT) 4 MG disintegrating tablet  Take 1 tablet (4 mg  total) by mouth every 8 (eight) hours as needed for nausea.   12 tablet   0      Home: Home Living Family/patient expects to be discharged to:: Private residence Living Arrangements: Spouse/significant other Available Help at Discharge: Family Type of Home: House Home Access: Stairs to enter Secretary/administrator of Steps: 5 Entrance Stairs-Rails: Can reach both Home Layout: One level Home Equipment: Bedside commode;Shower seat Additional Comments: Pt works at [employer].    Functional History:   Functional Status:   Mobility: Bed Mobility Bed Mobility: Not assessed Supine to Sit: 4: Min guard;HOB elevated;With rails Sitting - Scoot to Edge of Bed: 4: Min guard;With rail Sit to Supine: 4: Min guard;With rail;HOB flat Transfers Transfers: Sit to Stand;Stand to Sit Sit to Stand: 4: Min guard;Without upper extremity assist;From chair/3-in-1 Stand to Sit: 4: Min guard;Without upper extremity assist;To  chair/3-in-1 Ambulation/Gait Ambulation/Gait Assistance: 4: Min assist Ambulation Distance (Feet): 200 Feet (x2 with rest break between attempts.) Assistive device: Rolling walker Ambulation/Gait Assistance Details: Pt pushing herself during session today to ambulate farther than previous sessions. Pt states she is weak and feels fatigued but insists to continue ambulating despite therapist's encouragement to return to room. Standing rest breaks required for SOB. Min assist for occasional LOB. Gait Pattern: Step-through pattern;Decreased stride length;Scissoring;Antalgic Gait velocity: Decreased General Gait Details: Pt became ill during gait and began vomiting in hallway. Stairs: No   ADL: ADL Grooming: Minimal assistance Where Assessed - Grooming: Unsupported standing (needs built up foam to hold tooth brush. Dropping items) Upper Body Bathing: Chest;Right arm;Left arm;Abdomen;Minimal assistance Where Assessed - Upper Body Bathing: Unsupported sit to stand (LOB with self correction several times) Lower Body Bathing: Minimal assistance Where Assessed - Lower Body Bathing: Supported sitting (sitting on 3n1 for LB and dropping items) Upper Body Dressing: Minimal assistance Where Assessed - Upper Body Dressing: Supported sitting Lower Body Dressing: +1 Total assistance Where Assessed - Lower Body Dressing: Supported sitting (don socks total / MAX (A) to doff socks and underwear) Toilet Transfer: Minimal assistance Toilet Transfer Method: Sit to stand (cues for hand placement) Toilet Transfer Equipment: Raised toilet seat with arms (or 3-in-1 over toilet) Equipment Used: Rolling walker Transfers/Ambulation Related to ADLs: Pt transferring with RW. pt unsteady with RW use during session. Pt reports "i am not steady" Pt required > 4 steps to turn with RW. pt with loose grasp with LT UE on RW. pt unable to navigate narrowed space without (A) RW ADL Comments: Pt sitting on EOB on arrival. Pt  ambulated to bathroom to complete full ADL and grooming. Pt dropping adl items throughout session. Pt required rest breaks sitting on 3n1 for balance and fatigue. pt with Rt UE able to supinate/ pronate to grab items. Pt with LT UE apraxic movement. Pt reports "my hands feel like cardboard" Pt with changes in sensation. Pt placing wash cloth on dorsal aspect of hand to increase surface area to attempt to apply soap with Rt UE. pt undershooting. Pt required BIL UE to attempt to pick up all items. Pt unable to open deordorant round screw top. Pt required blue foam added to tooth brush. Pt using bil UE moving head to brush teeth. pt educated on using electric tooth brush to clean teeth. Pt needed prolonged time to complete full bath, brush teeth and comb hair. pt is incontinent of bladder and requires mesh panties with pad. Pt with unable to pull up mesh panties without (A).    Cognition: Cognition Overall  Cognitive Status: Impaired/Different from baseline Orientation Level: Oriented X4 Cognition Arousal/Alertness: Awake/alert Behavior During Therapy: Flat affect Overall Cognitive Status: Impaired/Different from baseline Area of Impairment: Safety/judgement;Awareness Current Attention Level: Sustained Memory: Decreased short-term memory Safety/Judgement: Decreased awareness of deficits;Decreased awareness of safety Awareness: Anticipatory General Comments: pt requires cues for hand placement and safety using RW. Pt attempting to sit without reaching for surface. Pt attempting to sit at sink 1 foot away from 3n1 surface. OT had to move 3n1 quickly to prevent fall Difficult to assess due to: Level of arousal   Physical Exam: Blood pressure 149/87, pulse 76, temperature 97.7 F (36.5 C), temperature source Oral, resp. rate 20, height 5' 5.5" (1.664 m), weight 97.977 kg (216 lb), SpO2 94.00%. Physical Exam  General: No acute distress Mood and affect are appropriate Heart: Regular rate and rhythm  no rubs murmurs or extra sounds Lungs: Clear to auscultation, breathing unlabored, no rales or wheezes Abdomen: Positive bowel sounds, soft nontender to palpation, nondistended Extremities: No clubbing, cyanosis, or edema Skin: No evidence of breakdown, no evidence of rash Neurologic: Cranial nerves II through XII intact, motor strength is 3-/5 in bilateral deltoid, bicep, tricep, grip, 4/5 Bilateral hip flexor, knee extensors, ankle dorsiflexor and plantar flexor Sensory exam normal sensation to light touch and proprioception in bilateral upper and lower extremities Cerebellar exam normal finger to nose to finger as well as heel to shin in bilateral upper and lower extremities Musculoskeletal: Full range of motion in lower extremities, decreased Bilat UE ROM. No joint swelling  No results found for this or any previous visit (from the past 48 hour(s)). No results found.   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 Patient is admitted to receive collaborative, interdisciplinary care between the physiatrist, rehab nursing staff, and therapy team. Patient's level of medical complexity and substantial therapy needs in context of that medical necessity cannot be provided at a lesser intensity of care such as a SNF. Patient has experienced substantial functional loss from his/her baseline which was documented above under the "Functional History" and "Functional Status" headings.  Judging by the patient's diagnosis, physical exam, and functional history, the patient has potential for functional progress which will result in measurable gains while on inpatient rehab.  These gains will be of substantial and practical use upon discharge  in facilitating mobility and self-care at the household level. Physiatrist will provide 24 hour management of medical needs as well as oversight of the therapy plan/treatment and  provide guidance as appropriate regarding the interaction of the two. 24 hour rehab nursing will assist with bladder management, bowel management, safety, skin/wound care, disease management, medication administration, pain management and patient education  and help integrate therapy concepts, techniques,education, etc. PT will assess and treat for/with: pre gait, gait training, endurance , safety, equipment, neuromuscular re education.   Goals are: sup/Mod I. OT will assess and treat for/with: ADLs, Cognitive perceptual skills, Neuromuscular re education, safety, endurance, equipment.   Goals are: Sup/Mod I. SLP will assess and treat for/with: NA.  Goals are: NA. Case Management and Social Worker will assess and treat for psychological issues and discharge planning. Team conference will be held weekly to assess progress toward goals and to determine barriers to discharge. Patient will receive at least 3 hours of therapy per day at least 5 days per week. ELOS: 7-10 days        Prognosis:  excellent   Medical Problem List and Plan:  1. DVT Prophylaxis/Anticoagulation: Pharmaceutical: Lovenox 2. Pain Management: Continue decadron--will add pepcid for GI prophylaxis.  Prn oxycodone effective.   3. Mood: No signs of distress noted. Will have LCSW follow for evaluation.   4. Neuropsych: This patient is capable of making decisions on her own behalf. 5. Constipation: Augment bowel program. SSE today if no results yet.   6.  Leucocytosis: Recheck in CBC in am.  Check UA/UCS.    Erick Colace M.D. [facility] FAAPM&R (Sports Med, Neuromuscular Med) Diplomate Am Board of Electrodiagnostic Med Diplomate Am Board of Pain Medicine Fellow Am Board of Interventional Pain Physicians 08/13/2013

## 2013-08-13 NOTE — Progress Notes (Signed)
Physical Therapy Treatment Patient Details Name: Shelia Davis MRN: [id number] DOB: 02/26/1951 Today's Date: 08/13/2013 Time: 7829-5621 PT Time Calculation (min): 30 min  PT Assessment / Plan / Recommendation  History of Present Illness H/o back fusion, now s/p anterior cervical decompression/discectomy at C3-C4   PT Comments   Pt very unsteady with ambulation and standing balance activities. Pt able to reach across midline and outside of COG seated and standing with assistance to regain erect posture. Pt demonstrated tandem stance and stance with decreased BOS for 10sec x3 each with assistance to maintain balance. Pt will benefit from CIR to increase functional independence prior to d/c home.  Follow Up Recommendations  CIR     Does the patient have the potential to tolerate intense rehabilitation     Barriers to Discharge        Equipment Recommendations  None recommended by PT    Recommendations for Other Services Rehab consult  Frequency Min 5X/week   Progress towards PT Goals Progress towards PT goals: Progressing toward goals  Plan Current plan remains appropriate    Precautions / Restrictions Precautions Precautions: Cervical Precaution Comments: cervical handout provided and reviewed with spouse Required Braces or Orthoses: Cervical Brace Cervical Brace: Soft collar;For comfort Restrictions Weight Bearing Restrictions: No   Pertinent Vitals/Pain Denied pain today    Mobility  Bed Mobility Bed Mobility: Sit to Sidelying Right Sit to Sidelying Right: 5: Supervision;HOB flat Transfers Transfers: Sit to Stand;Stand to Sit Sit to Stand: 4: Min guard;From chair/3-in-1;With upper extremity assist Stand to Sit: 4: Min guard;To chair/3-in-1;With upper extremity assist;To bed Details for Transfer Assistance: cues for hand placement  Ambulation/Gait Ambulation/Gait Assistance: 4: Min assist Ambulation Distance (Feet): 300 Feet Assistive device: Rolling walker;1 person  hand held assist;None Ambulation/Gait Assistance Details: Pt very unsteady with ambulation with and without AD. Pt had staggering gait, swayed, and inconsistent speeds. Min assist to maintain balance.   Gait Pattern: Step-through pattern;Decreased stride length;Scissoring;Trunk flexed Gait velocity: Decreased Stairs: No    Exercises     PT Diagnosis:    PT Problem List:   PT Treatment Interventions:     PT Goals (current goals can now be found in the care plan section)    Visit Information  Last PT Received On: 08/13/13 Assistance Needed: +1 History of Present Illness: H/o back fusion, now s/p anterior cervical decompression/discectomy at C3-C4    Subjective Data      Cognition  Cognition Arousal/Alertness: Awake/alert Behavior During Therapy: Flat affect Overall Cognitive Status: Impaired/Different from baseline Area of Impairment: Safety/judgement;Awareness Safety/Judgement: Decreased awareness of deficits;Decreased awareness of safety Awareness: Anticipatory    Balance     End of Session PT - End of Session Equipment Utilized During Treatment: Gait belt;Cervical collar Activity Tolerance: Patient limited by fatigue;Patient tolerated treatment well Patient left: in bed;with call bell/phone within reach;with nursing/sitter in room Nurse Communication: Mobility status   GP     Ernestina Columbia, SPTA 08/13/2013, 1:32 PM

## 2013-08-14 ENCOUNTER — Inpatient Hospital Stay (HOSPITAL_COMMUNITY): Payer: 59 | Admitting: Occupational Therapy

## 2013-08-14 ENCOUNTER — Inpatient Hospital Stay (HOSPITAL_COMMUNITY): Payer: 59

## 2013-08-14 DIAGNOSIS — M4712 Other spondylosis with myelopathy, cervical region: Secondary | ICD-10-CM

## 2013-08-14 DIAGNOSIS — K5900 Constipation, unspecified: Secondary | ICD-10-CM | POA: Diagnosis present

## 2013-08-14 LAB — CBC WITH DIFFERENTIAL/PLATELET
Basophils Absolute: 0 10*3/uL (ref 0.0–0.1)
Basophils Relative: 0 % (ref 0–1)
Eosinophils Absolute: 0 10*3/uL (ref 0.0–0.7)
HCT: 46.9 % — ABNORMAL HIGH (ref 36.0–46.0)
Lymphocytes Relative: 9 % — ABNORMAL LOW (ref 12–46)
MCHC: 33.7 g/dL (ref 30.0–36.0)
Monocytes Absolute: 1.8 10*3/uL — ABNORMAL HIGH (ref 0.1–1.0)
Neutro Abs: 22 10*3/uL — ABNORMAL HIGH (ref 1.7–7.7)
Neutrophils Relative %: 84 % — ABNORMAL HIGH (ref 43–77)
Platelets: 407 10*3/uL — ABNORMAL HIGH (ref 150–400)
RDW: 15.3 % (ref 11.5–15.5)
WBC: 26.2 10*3/uL — ABNORMAL HIGH (ref 4.0–10.5)

## 2013-08-14 LAB — BASIC METABOLIC PANEL
BUN: 21 mg/dL (ref 6–23)
Calcium: 9.8 mg/dL (ref 8.4–10.5)
Creatinine, Ser: 0.68 mg/dL (ref 0.50–1.10)
GFR calc Af Amer: >90 mL/min (ref >90)
GFR calc non Af Amer: >90 mL/min (ref >90)
Sodium: 136 mEq/L (ref 135–145)

## 2013-08-14 MED ORDER — DEXAMETHASONE 4 MG PO TABS
4.0000 mg | ORAL_TABLET | Freq: Three times a day (TID) | ORAL | Status: DC
  Administered 2013-08-14 – 2013-08-17 (×8): 4 mg via ORAL
  Filled 2013-08-14 (×11): qty 1

## 2013-08-14 NOTE — Progress Notes (Signed)
Subjective/Complaints: 62 y.o. female with history of DDD with gait disorder and prior cervical fusion C 4-C6. 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 Jeral Fruit. Post op lethargy imporving but patient continues with poor safety awareness, unsteady gait as well as numbness in hands  Slept ok, no severe pain  Review of Systems - Negative except neck pain, warm and cold feeling in hands  Objective: Vital Signs: Blood pressure 158/86, pulse 88, temperature 98 F (36.7 C), temperature source Oral, resp. rate 18, SpO2 95.00%. No results found. Results for orders placed during the hospital encounter of 08/13/13 (from the past 72 hour(s))  URINALYSIS, ROUTINE W REFLEX MICROSCOPIC     Status: None   Collection Time    08/13/13 10:18 PM      Result Value Range   Color, Urine YELLOW  YELLOW   APPearance CLEAR  CLEAR   Specific Gravity, Urine 1.015  1.005 - 1.030   pH 7.0  5.0 - 8.0   Glucose, UA NEGATIVE  NEGATIVE mg/dL   Hgb urine dipstick NEGATIVE  NEGATIVE   Bilirubin Urine NEGATIVE  NEGATIVE   Ketones, ur NEGATIVE  NEGATIVE mg/dL   Protein, ur NEGATIVE  NEGATIVE mg/dL   Urobilinogen, UA 0.2  0.0 - 1.0 mg/dL   Nitrite NEGATIVE  NEGATIVE   Leukocytes, UA NEGATIVE  NEGATIVE   Comment: MICROSCOPIC NOT DONE ON URINES WITH NEGATIVE PROTEIN, BLOOD, LEUKOCYTES, NITRITE, OR GLUCOSE <1000 mg/dL.     HEENT: normal and cervical collar Cardio: RRR and no murmurs Resp: CTA B/L and unlabored GI: BS positive and non tender, non distended Extremity:  Pulses positive and No Edema Skin:   Intact and Wound C/D/I and Left ant neck Neuro: Alert/Oriented, Cranial Nerve II-XII normal, Abnormal Sensory reduced sensation to LT left C6,7,8 dermatomes, Abnormal Motor 3-/5 Bilateral delt bi tri grip, 4- BLE and Abnormal FMC Ataxic/ dec FMC Musc/Skel:  Neck  tender Gen NAD   Assessment/Plan: 1. 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 Physiatrist is providing close team supervision and 24 hour management of active medical problems listed below. Physiatrist and rehab team continue to assess barriers to discharge/monitor patient progress toward functional and medical goals. FIM:                   Comprehension Comprehension Mode: Auditory Comprehension: 6-Follows complex conversation/direction: With extra time/assistive device  Expression Expression Mode: Verbal Expression: 6-Expresses complex ideas: With extra time/assistive device  Social Interaction Social Interaction: 6-Interacts appropriately with others with medication or extra time (anti-anxiety, antidepressant).  Problem Solving Problem Solving: 5-Solves complex 90% of the time/cues < 10% of the time  Memory Memory: 6-More than reasonable amt of time Medical Problem List and Plan:  1. DVT Prophylaxis/Anticoagulation: Pharmaceutical: Lovenox  2. Pain Management: Continue decadron--will add pepcid for GI prophylaxis. Prn oxycodone effective.  3. Mood: No signs of distress noted. Will have LCSW follow for evaluation.  4. Neuropsych: This patient is capable of making decisions on her own behalf.  5. Constipation: Augment bowel program. SSE today if no results yet.  6. Leucocytosis: Recheck in CBC in am. Check UA/UCS.     LOS (Days) 1 A FACE TO FACE EVALUATION WAS PERFORMED  Devun Anna E 08/14/2013, 7:57 AM

## 2013-08-14 NOTE — Progress Notes (Addendum)
1800.  Pt's husband and son expressed concern re: pt's cognition and "memory."  Had been discussed in team conference today as well.  Family states not baseline.  Also noted pt's WBC's 26.2 with afternoon draw. Report to eve shift; note to MD re: noted issues.

## 2013-08-14 NOTE — Progress Notes (Signed)
Occupational Therapy Session Notes  Patient Details  Name: Shelia Davis MRN: [id number] Date of Birth: 22-Oct-1950  Today's Date: 08/14/2013  Short Term Goals: Week 1:  OT Short Term Goal 1 (Week 1): STG=LTG d/t short ELOS  Skilled Therapeutic Interventions/Progress Updates:   Session #1 1030-1100 - 30 Minutes Individual Therapy Patient with no complaints of pain, "just weak". Patient found in hallway with RN. Therapist propelled patient -> therapy gym for bilateral hand testing (grip and pinch strength).   Right hand grip = 1st-12lbs   2nd-27lbs   3rd-30lbs Right pinch = average-5lbs Right 3 point pinch = average 8lbs Right lateral pinch = average 12.5lbs  Left hand grip = 1st-17lbs   2nd- 20lbs   3rd-20lbs Left pinch = average 6.5 lbs Left 3 point pinch = average 9lbs Left lateral pinch = average 9.5lbs  After testing, patient transferred back to w/c and therapist assisted patient back to room. Patient left seated in w/c with quick release belt donned and needed items within reach.  Session #2 7829-5621 - 54 Minutes Individual Therapy No complaints of pain Patient found supine in bed asleep. Patient easy to awake and arouse. Patient ambulated from room -> therapy gym using rolling walker and minimal assistance. Patient sat on therapy mat and engaged in BUE fine motor coordination and strengthening exercises in standing position. Patient with request to use bathroom. Patient ambulated from gym -> ADL apartment without AD (patient able to perform functional mobility without AD safer then with an AD at this time). Patient engaged in toilet transfer with minimal assistance and toileting tasks with moderate assistance and grooming tasks standing at sink with steady assist. From here, patient ambulated back to gym without AD and completed therapeutic exercise. Once this was completed, patient ambulated back to room and left seated in recliner with quick release belt donned and all needed  items within reach.   Precautions:  Precautions Precautions: Cervical Required Braces or Orthoses: Cervical Brace Cervical Brace: Soft collar;For comfort Restrictions Weight Bearing Restrictions: No  General: General Chart Reviewed: Yes Family/Caregiver Present: No  See FIM for current functional status  Pearson Reasons 08/14/2013, 9:55 AM

## 2013-08-14 NOTE — Evaluation (Signed)
bOccupational Therapy Assessment and Plan  Patient Details  Name: Shelia Davis MRN: [id number] Date of Birth: 02/13/1951  OT Diagnosis: apraxia and muscle weakness (generalized) Rehab Potential: Rehab Potential: Excellent ELOS: 7-10 days   Today's Date: 08/14/2013 Time: 4098-1191 Time Calculation (min): 65 min  Problem List: There are no active problems to display for this patient.   Past Medical History:  Past Medical History  Diagnosis Date  . Osteoarthritis   . PONV (postoperative nausea and vomiting)    Past Surgical History:  Past Surgical History  Procedure Laterality Date  . Cholecystectomy    . Abdominal hysterectomy    . Neck fusion    . Falls      numerous breaks from falls,wrist X3, fingers,lt leg,ankle,foot  . Fracture surgery      freq. falls  . Anterior cervical decomp/discectomy fusion N/A 08/07/2013    Procedure: CERVICAL THREE-FOUR ANTERIOR CERVICAL DECOMPRESSION/DISCECTOMY FUSION 1 LEVEL;  Surgeon: [name], MD;  Location: [facility];  Service: Neurosurgery;  Laterality: N/A;  C3-4 Anterior cervical decompression/diskectomy/fusion    Assessment & Plan Clinical Impression: Patient is a 62 y.o. year old female with history of DDD with gait disorder and prior cervical fusion C 4-C6. 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 Jeral Fruit. Post op lethargy imporving but patient continues with poor safety awareness, unsteady gait as well as numbness in hands. She had syncopal episode past surgery and BP stabilizing. On steroid taper currently. MD, PT recommending CIR.   Patient transferred to CIR on 08/13/2013 .    Patient currently requires mod with basic self-care skills secondary to motor apraxia and decreased coordination.  Prior to hospitalization, patient could complete BADL/iADL with independent  .  Patient will benefit from skilled intervention to increase independence with basic self-care skills prior to discharge home with care partner.  Anticipate patient will require intermittent supervision and follow up home health.  OT - End of Session Activity Tolerance: Tolerates 30+ min activity with multiple rests Endurance Deficit: Yes OT Assessment Rehab Potential: Excellent Barriers to Discharge: Decreased caregiver support Barriers to Discharge Comments: husband available evenings and weekends OT Patient demonstrates impairments in the following area(s): Balance;Endurance;Motor;Safety;Sensory OT Basic ADL's Functional Problem(s): Eating;Grooming;Bathing;Dressing;Toileting OT Advanced ADL's Functional Problem(s): Simple Meal Preparation OT Transfers Functional Problem(s): Toilet;Tub/Shower OT Plan OT Intensity: Minimum of 1-2 x/day, 45 to 90 minutes OT Frequency: 5 out of 7 days OT Duration/Estimated Length of Stay: 7-10 days OT Treatment/Interventions: Balance/vestibular training;Discharge planning;Therapeutic Exercise;Therapeutic Activities;UE/LE Coordination activities;UE/LE Strength taining/ROM;DME/adaptive equipment instruction;Self Care/advanced ADL retraining;Patient/family education;Functional mobility training OT Self Feeding Anticipated Outcome(s): Mod I OT Basic Self-Care Anticipated Outcome(s): Mod I OT Toileting Anticipated Outcome(s): Mod I OT Bathroom Transfers Anticipated Outcome(s): Mod I OT Recommendation Patient destination: Home Follow Up Recommendations: Home health OT Equipment Recommended: Tub/shower seat  Skilled Therapeutic Intervention 1:1 OT initial evaluation completed with intervention provided to emphasize safety, improved gross/fine motor control of bil UE, dynamic sitting/standing balance, and functional mobility using RW.   Patient required moderate cues for safety and slow rate of movement due to moderate impulsivity and occasional mild LOB while  ambulating.   Patient required close supervision during ADL for safety and moderate physical assist with dressing tasks due to fatigue and claim of poor sleep from previous night.   Patient presented with possible cognitive deficits relating to diminished  awareness, problem-solving, and attention to be further assessed although per patient, she was fully employed and independent with BADL/iADL prior to admission.  OT Evaluation Precautions/Restrictions  Precautions Precautions: Cervical Cervical Brace: Soft collar;For comfort  General Chart Reviewed: Yes Family/Caregiver Present: No  Vital Signs Therapy Vitals Temp: 98 F (36.7 C) Temp src: Oral Pulse Rate: 88 Resp: 18 BP: 158/86 mmHg Patient Position, if appropriate: Lying Oxygen Therapy SpO2: 95 % O2 Device: None (Room air)  Pain Pain Assessment Pain Assessment: 0-10 Pain Score: 5  Pain Type: Acute pain Pain Location: Neck Pain Descriptors / Indicators: Spasm Pain Onset: With Activity Pain Intervention(s): Medication (See eMAR);Rest Multiple Pain Sites: No  Home Living/Prior Functioning Home Living Available Help at Discharge: Family Type of Home: House Home Access: Stairs to enter Secretary/administrator of Steps: 5 Entrance Stairs-Rails: Can reach both;Right;Left Home Layout: One level  Lives With: Spouse IADL History Homemaking Responsibilities: Yes Meal Prep Responsibility: Primary Laundry Responsibility: Primary Cleaning Responsibility: Primary Bill Paying/Finance Responsibility: Secondary Shopping Responsibility: Primary Current License: Yes Mode of Transportation: Car Education: HS Occupation: Full time employment Type of Occupation: Location manager Leisure and Hobbies: prior bowling, camping Prior Function Level of Independence: Independent with basic ADLs;Independent with homemaking with ambulation  Able to Take Stairs?: Yes Driving: Yes Vocation: Full time employment  ADL See FIM for performance  of ADL  Vision/Perception  Vision - History Baseline Vision: Wears glasses all the time Patient Visual Report: No change from baseline Vision - Assessment Eye Alignment: Within Functional Limits Perception Perception: Within Functional Limits Praxis Praxis: Intact   Cognition Overall Cognitive Status: Impaired/Different from baseline Arousal/Alertness: Awake/alert Orientation Level: Oriented X4 Attention: Focused Focused Attention: Appears intact Memory: Appears intact Awareness: Impaired Awareness Impairment: Intellectual impairment Problem Solving: Impaired Problem Solving Impairment: Functional complex;Functional basic Executive Function: Landscape architect: Impaired Behaviors: Restless Safety/Judgment: Impaired  Sensation Sensation Light Touch: Impaired by gross assessment Stereognosis: Impaired by gross assessment Hot/Cold: Impaired by gross assessment Proprioception: Impaired by gross assessment Additional Comments: bil hands, left worse than right.   Sensation returning to right hand, inconsistently (light touch, temp) Coordination Gross Motor Movements are Fluid and Coordinated: Yes Fine Motor Movements are Fluid and Coordinated: No  Motor  Motor Motor: Within Functional Limits  Mobility  Transfers Transfers: Sit to Stand;Stand to Sit Sit to Stand: 5: Supervision Sit to Stand Details: Verbal cues for precautions/safety Stand to Sit: 5: Supervision Stand to Sit Details (indicate cue type and reason): Verbal cues for precautions/safety   Trunk/Postural Assessment  Cervical Assessment Cervical Assessment: Exceptions to [facility] Thoracic Assessment Thoracic Assessment: Within Functional Limits Lumbar Assessment Lumbar Assessment: Within Functional Limits Postural Control Postural Control: Within Functional Limits   Balance Static Sitting Balance Static Sitting - Level of Assistance: 5: Stand by assistance Static Standing Balance Static Standing -  Level of Assistance: 4: Min assist Dynamic Standing Balance Dynamic Standing - Level of Assistance: 4: Min assist  Extremity/Trunk Assessment RUE Assessment RUE Assessment: Exceptions to [facility] RUE AROM (degrees) Right Shoulder Flexion: 150 Degrees LUE Assessment LUE Assessment: Exceptions to WFL LUE AROM (degrees) Left Shoulder Flexion: 150 Degrees  FIM:  FIM - Eating Eating Activity: 5: Set-up assist for open containers;6: More than reasonable amount of time FIM - Grooming Grooming Steps: Wash, rinse, dry face FIM - Bathing Bathing Steps Patient Completed: Chest;Right Arm;Left Arm;Abdomen;Front perineal area;Buttocks;Right upper leg;Left upper leg Bathing: 4: Min-Patient completes 8-9 2f 10 parts or 75+ percent FIM - Upper Body Dressing/Undressing Upper body dressing/undressing steps patient completed: Thread/unthread  right bra strap;Thread/unthread left bra strap;Thread/unthread right sleeve of pullover shirt/dresss;Thread/unthread left sleeve of pullover shirt/dress Upper body dressing/undressing: 3: Mod-Patient completed 50-74% of tasks FIM - Lower Body Dressing/Undressing Lower body dressing/undressing steps patient completed: Thread/unthread right underwear leg;Thread/unthread left underwear leg;Thread/unthread right pants leg;Thread/unthread left pants leg Lower body dressing/undressing: 3: Mod-Patient completed 50-74% of tasks FIM - Toileting Toileting steps completed by patient: Adjust clothing prior to toileting;Performs perineal hygiene;Adjust clothing after toileting Toileting: 5: Supervision: Safety issues/verbal cues FIM - Bed/Chair Transfer Bed/Chair Transfer Assistive Devices: Manufacturing systems engineer Transfer: 5: Supine > Sit: Supervision (verbal cues/safety issues);4: Bed > Chair or W/C: Min A (steadying Pt. > 75%) FIM - Diplomatic Services operational officer Devices: Grab bars;Walker Toilet Transfers: 5-To toilet/BSC: Supervision (verbal cues/safety issues);5-From  toilet/BSC: Supervision (verbal cues/safety issues) FIM - Secretary/administrator Devices: Tub transfer bench;Walker;Walk in shower;Grab bars Tub/shower Transfers: 5-Into Tub/Shower: Supervision (verbal cues/safety issues);5-Out of Tub/Shower: Supervision (verbal cues/safety issues)   Refer to Care Plan for Long Term Goals  Recommendations for other services: None  Discharge Criteria: Patient will be discharged from OT if patient refuses treatment 3 consecutive times without medical reason, if treatment goals not met, if there is a change in medical status, if patient makes no progress towards goals or if patient is discharged from hospital.  The above assessment, treatment plan, treatment alternatives and goals were discussed and mutually agreed upon: by patient  [facility] 08/14/2013, 9:45 AM

## 2013-08-14 NOTE — Progress Notes (Signed)
Patient information reviewed and entered into eRehab system by Abdulwahab Demelo, RN, CRRN, PPS Coordinator.  Information including medical coding and functional independence measure will be reviewed and updated through discharge.    

## 2013-08-14 NOTE — Evaluation (Signed)
Physical Therapy Assessment and Plan  Patient Details  Name: Shelia Davis MRN: [id number] Date of Birth: 10-27-1950  PT Diagnosis: Abnormality of gait, Difficulty walking, Impaired sensation, Muscle weakness and Pain in cervical region Rehab Potential: Good ELOS: 7 days   Today's Date: 08/14/2013 Time: 4098-1191 Time Calculation (min): 60 min  Problem List: There are no active problems to display for this patient.   Past Medical History:  Past Medical History  Diagnosis Date  . Osteoarthritis   . PONV (postoperative nausea and vomiting)    Past Surgical History:  Past Surgical History  Procedure Laterality Date  . Cholecystectomy    . Abdominal hysterectomy    . Neck fusion    . Falls      numerous breaks from falls,wrist X3, fingers,lt leg,ankle,foot  . Fracture surgery      freq. falls  . Anterior cervical decomp/discectomy fusion N/A 08/07/2013    Procedure: CERVICAL THREE-FOUR ANTERIOR CERVICAL DECOMPRESSION/DISCECTOMY FUSION 1 LEVEL;  Surgeon: [name], MD;  Location: [facility];  Service: Neurosurgery;  Laterality: N/A;  C3-4 Anterior cervical decompression/diskectomy/fusion    Assessment & Plan Clinical Impression: Patient is a 62 y.o. year old female with recent admission to the hospital with history of DDD with gait disorder and prior cervical fusion C 4-C6. 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 Jeral Fruit. Post op lethargy imporving but patient continues with poor safety awareness, unsteady gait as well as numbness in hands. She had syncopal episode past surgery and BP stabilizing. On steroid taper currently. MD, PT recommending CIR. Patient complaining of hand pain and numbness and tingling bilateral as well as weakness.  Patient transferred to CIR on 08/13/2013 .   Patient currently requires min with  mobility secondary to muscle weakness, decreased cardiorespiratoy endurance, decreased attention, decreased awareness, decreased problem solving, decreased safety awareness and decreased standing balance, decreased postural control and decreased balance strategies.  Prior to hospitalization, patient was independent  with mobility and lived with Spouse in a House home.  Home access is 3Stairs to enter.  Patient will benefit from skilled PT intervention to maximize safe functional mobility, minimize fall risk and decrease caregiver burden for planned discharge home with 24 hour supervision.  Anticipate patient will benefit from follow up HH at discharge.  PT - End of Session Endurance Deficit: Yes PT Assessment Rehab Potential: Good Barriers to Discharge: Decreased caregiver support (husband has multiple health issues) PT Patient demonstrates impairments in the following area(s): Balance;Endurance;Perception;Pain;Safety;Sensory;Skin Integrity PT Transfers Functional Problem(s): Bed Mobility;Bed to Chair;Car;Furniture;Floor PT Locomotion Functional Problem(s): Ambulation;Stairs PT Plan PT Intensity: Minimum of 1-2 x/day ,45 to 90 minutes PT Frequency: 5 out of 7 days PT Duration Estimated Length of Stay: 7 days PT Treatment/Interventions: Ambulation/gait training;Balance/vestibular training;Cognitive remediation/compensation;Community reintegration;Discharge planning;DME/adaptive equipment instruction;Functional mobility training;Neuromuscular re-education;Pain management;Patient/family education;Psychosocial support;Skin care/wound management;Stair training;Therapeutic Activities;Therapeutic Exercise;UE/LE Strength taining/ROM;UE/LE Coordination activities;Wheelchair propulsion/positioning PT Transfers Anticipated Outcome(s): S overall PT Locomotion Anticipated Outcome(s): S overall gait and stairs PT Recommendation Follow Up Recommendations: Home health PT;24 hour supervision/assistance Patient  destination: Home Equipment Recommended: Rolling walker with 5" wheels  Skilled Therapeutic Intervention Individual treatment initiated with focus on functional transfers, emphasis on safety with mobility (pt somewhat impulsive and cues for safety needed), gait with and without AD (introduced RW for balance), stair negotiation for home entry simulation, and overall endurance/activity tolerance. Pt  became tearful during session and emotional support provided. Pt concerned with impairments in her hands.   PT Evaluation Precautions/Restrictions Precautions Precautions: Cervical Required Braces or Orthoses: Cervical Brace Cervical Brace: Soft collar;For comfort Restrictions Weight Bearing Restrictions: No Pain Reports discomfort in neck - premedicated. Home Living/Prior Functioning Home Living Available Help at Discharge: Family Type of Home: House Home Access: Stairs to enter Secretary/administrator of Steps: 3 Entrance Stairs-Rails: Can reach both;Right;Left Home Layout: One level  Lives With: Spouse Prior Function Level of Independence: Independent with basic ADLs;Independent with homemaking with ambulation  Able to Take Stairs?: Yes Driving: Yes Vocation: Full time employment Vision/Perception  Vision - History Baseline Vision: Wears glasses all the time Patient Visual Report: No change from baseline Vision - Assessment Eye Alignment: Within Functional Limits Perception Perception: Within Functional Limits Praxis Praxis: Intact  Cognition Overall Cognitive Status: Impaired/Different from baseline Arousal/Alertness: Awake/alert Orientation Level: Oriented X4 Attention: Focused Focused Attention: Appears intact Memory: Appears intact Awareness: Impaired Awareness Impairment: Intellectual impairment Problem Solving: Impaired Problem Solving Impairment: Functional complex;Functional basic Executive Function: Landscape architect: Impaired Behaviors:  Restless Safety/Judgment: Impaired Sensation Sensation Light Touch: Impaired by gross assessment (LE's intact to light touch; reports numbess in Bilat hands) Stereognosis: Impaired by gross assessment Hot/Cold: Impaired by gross assessment Proprioception: Impaired by gross assessment Additional Comments: bil hands, left worse than right.   Sensation returning to right hand, inconsistently (light touch, temp) Coordination Gross Motor Movements are Fluid and Coordinated: Yes Fine Motor Movements are Fluid and Coordinated: No Motor  Motor Motor: Within Functional Limits (generalized weakness)   Locomotion  Ambulation Ambulation/Gait Assistance: 4: Min assist Ambulation/Gait Assistance Details: decereased balance and safety awareness  Trunk/Postural Assessment  Cervical Assessment Cervical Assessment: Exceptions to [facility] Thoracic Assessment Thoracic Assessment: Within Functional Limits Lumbar Assessment Lumbar Assessment: Within Functional Limits Postural Control Postural Control: Deficits on evaluation (decreased balance strategies in standing)  Balance Static Sitting Balance Static Sitting - Level of Assistance: 5: Stand by assistance Static Standing Balance Static Standing - Level of Assistance: 4: Min assist Dynamic Standing Balance Dynamic Standing - Level of Assistance: 4: Min assist Extremity Assessment  RUE Assessment RUE Assessment: Exceptions to [facility] RUE AROM (degrees) Right Shoulder Flexion: 150 Degrees LUE Assessment LUE Assessment: Exceptions to WFL LUE AROM (degrees) Left Shoulder Flexion: 150 Degrees RLE Assessment RLE Assessment: Within Functional Limits (decreased muscular endurance) LLE Assessment LLE Assessment: Within Functional Limits (decreased muscular endurance)  FIM:  FIM - Bed/Chair Transfer Bed/Chair Transfer Assistive Devices: Therapist, occupational: 5: Supine > Sit: Supervision (verbal cues/safety issues);4: Bed > Chair or W/C: Min A  (steadying Pt. > 75%);4: Chair or W/C > Bed: Min A (steadying Pt. > 75%);5: Sit > Supine: Supervision (verbal cues/safety issues) FIM - Locomotion: Wheelchair Locomotion: Wheelchair: 1: Total Assistance/staff pushes wheelchair (Pt<25%) (gait to be primary means) FIM - Locomotion: Ambulation Ambulation/Gait Assistance: 4: Min assist Locomotion: Ambulation: 2: Travels 50 - 149 ft with minimal assistance (Pt.>75%) FIM - Locomotion: Stairs Locomotion: Building control surveyor: Hand rail - 2 Locomotion: Stairs: 2: Up and Down 4 - 11 stairs with minimal assistance (Pt.>75%)   Refer to Care Plan for Long Term Goals  Recommendations for other services: None  Discharge Criteria: Patient will be discharged from PT if patient refuses treatment 3 consecutive times without medical reason, if treatment goals not met, if there is a change in medical status, if patient makes no progress towards goals or if patient is discharged from hospital.  The above assessment, treatment plan,  treatment alternatives and goals were discussed and mutually agreed upon: by patient  Tedd Sias 08/14/2013, 9:40 AM

## 2013-08-15 ENCOUNTER — Inpatient Hospital Stay (HOSPITAL_COMMUNITY): Payer: 59 | Admitting: Physical Therapy

## 2013-08-15 ENCOUNTER — Inpatient Hospital Stay (HOSPITAL_COMMUNITY): Payer: 59

## 2013-08-15 ENCOUNTER — Encounter (HOSPITAL_COMMUNITY): Payer: 59

## 2013-08-15 DIAGNOSIS — M4712 Other spondylosis with myelopathy, cervical region: Secondary | ICD-10-CM

## 2013-08-15 LAB — URINE CULTURE
Colony Count: NO GROWTH
Culture: NO GROWTH

## 2013-08-15 MED ORDER — ENOXAPARIN SODIUM 40 MG/0.4ML ~~LOC~~ SOLN
40.0000 mg | SUBCUTANEOUS | Status: DC
  Administered 2013-08-15 – 2013-08-22 (×8): 40 mg via SUBCUTANEOUS
  Filled 2013-08-15 (×9): qty 0.4

## 2013-08-15 MED ORDER — ACETAMINOPHEN 325 MG PO TABS
650.0000 mg | ORAL_TABLET | Freq: Four times a day (QID) | ORAL | Status: DC | PRN
  Administered 2013-08-15 – 2013-08-22 (×13): 650 mg via ORAL
  Filled 2013-08-15 (×14): qty 2

## 2013-08-15 NOTE — Progress Notes (Signed)
Physical Therapy Session Note  Patient Details  Name: Shelia Davis MRN: [id number] Date of Birth: [birth year]  Today's Date: 08/15/2013 Time: 1000-1055 Time Calculation (min): 55 min  Short Term Goals: Week 1:  PT Short Term Goal 1 (Week 1): = LTGs  Skilled Therapeutic Interventions/Progress Updates:    Berg Balance Test, results below. Explained to pt results to which she replied "oh I thought I was better than that." Decreased awareness of deficits during treatment also noted "you don't need to help me I'm good" when ambulating with and without device. Forward head poster and rounded shoulders likely contributing to shoulder/trap pain in addition to neck pain, practiced scapular retraction and chin tucks.   Ambulation with RW 2 x 150' with constant min assist cues for speed. Modified single limb stance activity with bil. UE fine motor task, no UE support. Ambulation without device x 120' with min/mod assist for dynamic balance, Lt. Knee hyperextension worse without device and with fatigue.    PT facilitating Lt. Knee control with all standing activities due to hyperextension during stance on Lt. decreased postural stability and decreased balance reactions. Addressed safety awareness throughout session.  Therapy Documentation Precautions:  Precautions Precautions: Cervical Required Braces or Orthoses: Cervical Brace Cervical Brace: Soft collar;For comfort Restrictions Weight Bearing Restrictions: No Pain: Pain Assessment Faces Pain Scale: Hurts little more Pain Type: Acute pain Pain Location: Shoulder (Trap) Pain Orientation: Left Pain Descriptors / Indicators: Aching Pain Onset: On-going Pain Intervention(s): Repositioned  Balance: Standardized Balance Assessment Standardized Balance Assessment: Berg Balance Test Berg Balance Test Sit to Stand: Able to stand  independently using hands Standing Unsupported: Able to stand 30 seconds unsupported Sitting with Back Unsupported  but Feet Supported on Floor or Stool: Able to sit safely and securely 2 minutes Stand to Sit: Sits safely with minimal use of hands Transfers: Needs one person to assist Standing Unsupported with Eyes Closed: Needs help to keep from falling Standing Ubsupported with Feet Together: Needs help to attain position and unable to hold for 15 seconds From Standing, Reach Forward with Outstretched Arm: Loses balance while trying/requires external support From Standing Position, Pick up Object from Floor: Unable to try/needs assist to keep balance From Standing Position, Turn to Look Behind Over each Shoulder: Needs supervision when turning Turn 360 Degrees: Needs assistance while turning Standing Unsupported, Alternately Place Feet on Step/Stool: Needs assistance to keep from falling or unable to try Standing Unsupported, One Foot in Front: Needs help to step but can hold 15 seconds Standing on One Leg: Unable to try or needs assist to prevent fall Total Score: 16  See FIM for current functional status  Therapy/Group: Individual Therapy  Wilhemina Bonito 08/15/2013, 12:36 PM

## 2013-08-15 NOTE — Progress Notes (Signed)
Physical Therapy Session Note  Patient Details  Name: Shelia Davis MRN: [id number] Date of Birth: 10-23-1950  Today's Date: 08/15/2013 Time: 1350-1445 Time Calculation (min): 55 min  Short Term Goals: Week 1:  PT Short Term Goal 1 (Week 1): = LTGs  Skilled Therapeutic Interventions/Progress Updates:    Pt motivated to improve however continues to demonstrate unsafe behaviors such as standing up from unlocked chair, lifting RW with turns, and somewhat impulsive movements requires mod cues for self-monitoring and safety awareness. Bathroom mobility with RW and min assist for balance and safety. Neuro re-education for standing balance and Lt. Knee stability during single limb stance (step up/downs, toe tapping, and ambulation with PT facilitating Lt. Knee control) carried over to ambulation 3 x 150' with min-guard/min assist PT providing tactile and verbal cues for lt. Knee control.   Car transfer with RW x 1 rep min-guard assist pt demonstrating safe sequencing.  Therapy Documentation Precautions:  Precautions Precautions: Cervical Required Braces or Orthoses: Cervical Brace Cervical Brace: Soft collar;For comfort Restrictions Weight Bearing Restrictions: No Pain: Pain Assessment Pain Assessment: No/denies pain Pain Score: 4  Pain Type: Acute pain Pain Location: Neck Pain Orientation: Left Pain Descriptors / Indicators: Aching Pain Frequency: Intermittent Pain Onset: Gradual Patients Stated Pain Goal: 3 Pain Intervention(s): Refused (refused offer of pain medication)  See FIM for current functional status  Therapy/Group: Individual Therapy  Wilhemina Bonito 08/15/2013, 2:48 PM

## 2013-08-15 NOTE — Progress Notes (Addendum)
Subjective/Complaints: 62 y.o. female with history of DDD with gait disorder and prior cervical fusion C 4-C6. 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 Jeral Fruit. Post op lethargy imporving but patient continues with poor safety awareness, unsteady gait as well as numbness in hands  Cognitive deficits noted by therapy, forgetful  Review of Systems - Negative except neck pain, warm and cold feeling in hands  Objective: Vital Signs: Blood pressure 164/92, pulse 66, temperature 97 F (36.1 C), temperature source Oral, resp. rate 20, weight 94.3 kg (207 lb 14.3 oz), SpO2 92.00%. No results found. Results for orders placed during the hospital encounter of 08/13/13 (from the past 72 hour(s))  URINALYSIS, ROUTINE W REFLEX MICROSCOPIC     Status: None   Collection Time    08/13/13 10:18 PM      Result Value Range   Color, Urine YELLOW  YELLOW   APPearance CLEAR  CLEAR   Specific Gravity, Urine 1.015  1.005 - 1.030   pH 7.0  5.0 - 8.0   Glucose, UA NEGATIVE  NEGATIVE mg/dL   Hgb urine dipstick NEGATIVE  NEGATIVE   Bilirubin Urine NEGATIVE  NEGATIVE   Ketones, ur NEGATIVE  NEGATIVE mg/dL   Protein, ur NEGATIVE  NEGATIVE mg/dL   Urobilinogen, UA 0.2  0.0 - 1.0 mg/dL   Nitrite NEGATIVE  NEGATIVE   Leukocytes, UA NEGATIVE  NEGATIVE   Comment: MICROSCOPIC NOT DONE ON URINES WITH NEGATIVE PROTEIN, BLOOD, LEUKOCYTES, NITRITE, OR GLUCOSE <1000 mg/dL.  URINE CULTURE     Status: None   Collection Time    08/13/13 10:18 PM      Result Value Range   Specimen Description URINE, CLEAN CATCH     Special Requests NONE     Culture  Setup Time       Value: 08/14/2013 05:16     Performed at [facility] Count       Value: NO GROWTH     Performed at [facility]   Culture       Value: NO GROWTH     Performed at [facility]   Report Status 08/15/2013 FINAL    BASIC METABOLIC PANEL     Status: Abnormal   Collection Time    08/14/13 11:56 AM      Result Value Range   Sodium 136  135 - 145 mEq/L   Potassium 4.2  3.5 - 5.1 mEq/L   Chloride 96  96 - 112 mEq/L   CO2 25  19 - 32 mEq/L   Glucose, Bld 167 (*) 70 - 99 mg/dL   BUN 21  6 - 23 mg/dL   Creatinine, Ser 1.61  0.50 - 1.10 mg/dL   Calcium 9.8  8.4 - 09.6 mg/dL   GFR calc non Af Amer >90  >90 mL/min   GFR calc Af Amer >90  >90 mL/min   Comment: (NOTE)     The eGFR has been calculated using the CKD EPI equation.     This calculation has not been validated in all clinical situations.     eGFR's persistently <90 mL/min signify possible Chronic Kidney     Disease.  CBC WITH DIFFERENTIAL     Status: Abnormal   Collection Time    08/14/13 11:56 AM      Result Value Range  WBC 26.2 (*) 4.0 - 10.5 K/uL   RBC 5.62 (*) 3.87 - 5.11 MIL/uL   Hemoglobin 15.8 (*) 12.0 - 15.0 g/dL   HCT 16.1 (*) 09.6 - 04.5 %   MCV 83.5  78.0 - 100.0 fL   MCH 28.1  26.0 - 34.0 pg   MCHC 33.7  30.0 - 36.0 g/dL   RDW 40.9  81.1 - 91.4 %   Platelets 407 (*) 150 - 400 K/uL   Neutrophils Relative % 84 (*) 43 - 77 %   Lymphocytes Relative 9 (*) 12 - 46 %   Monocytes Relative 7  3 - 12 %   Eosinophils Relative 0  0 - 5 %   Basophils Relative 0  0 - 1 %   Neutro Abs 22.0 (*) 1.7 - 7.7 K/uL   Lymphs Abs 2.4  0.7 - 4.0 K/uL   Monocytes Absolute 1.8 (*) 0.1 - 1.0 K/uL   Eosinophils Absolute 0.0  0.0 - 0.7 K/uL   Basophils Absolute 0.0  0.0 - 0.1 K/uL   RBC Morphology POLYCHROMASIA PRESENT       HEENT: normal and cervical collar Cardio: RRR and no murmurs Resp: CTA B/L and unlabored GI: BS positive and non tender, non distended Extremity:  Pulses positive and No Edema Skin:   Intact and Wound C/D/I and Left ant neck Neuro: Alert/Oriented, Cranial Nerve II-XII normal, Abnormal Sensory reduced sensation to LT left C6,7,8 dermatomes, Abnormal Motor 3-/5 Bilateral delt bi  tri grip, 4- BLE and Abnormal FMC Ataxic/ dec FMC Musc/Skel:  Neck tender Gen NAD   Assessment/Plan: 1. 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 Physiatrist is providing close team supervision and 24 hour management of active medical problems listed below. Physiatrist and rehab team continue to assess barriers to discharge/monitor patient progress toward functional and medical goals. FIM: FIM - Bathing Bathing Steps Patient Completed: Chest;Right Arm;Left Arm;Abdomen;Front perineal area;Buttocks;Right upper leg;Left upper leg;Right lower leg (including foot);Left lower leg (including foot) Bathing: 5: Supervision: Safety issues/verbal cues  FIM - Upper Body Dressing/Undressing Upper body dressing/undressing steps patient completed: Thread/unthread right bra strap;Thread/unthread left bra strap;Thread/unthread right sleeve of pullover shirt/dresss;Thread/unthread left sleeve of pullover shirt/dress;Put head through opening of pull over shirt/dress;Pull shirt over trunk Upper body dressing/undressing: 4: Min-Patient completed 75 plus % of tasks FIM - Lower Body Dressing/Undressing Lower body dressing/undressing steps patient completed: Thread/unthread right underwear leg;Thread/unthread left underwear leg;Pull underwear up/down;Thread/unthread right pants leg;Thread/unthread left pants leg;Pull pants up/down;Don/Doff right sock;Don/Doff left sock Lower body dressing/undressing: 4: Min-Patient completed 75 plus % of tasks  FIM - Toileting Toileting steps completed by patient: Adjust clothing prior to toileting;Performs perineal hygiene;Adjust clothing after toileting Toileting: 5: Supervision: Safety issues/verbal cues  FIM - Archivist Transfers Assistive Devices: Grab bars Toilet Transfers: 5-To toilet/BSC: Supervision (verbal  cues/safety issues);5-From toilet/BSC: Supervision (verbal cues/safety issues)  FIM - Banker Devices: Therapist, occupational: 5: Supine > Sit: Supervision (verbal cues/safety issues);4: Bed > Chair or W/C: Min A (steadying Pt. > 75%);4: Chair or W/C > Bed: Min A (steadying Pt. > 75%);5: Sit > Supine: Supervision (verbal cues/safety issues)  FIM - Locomotion: Wheelchair Locomotion: Wheelchair: 1: Total Assistance/staff pushes wheelchair (Pt<25%) (gait to be primary means) FIM - Locomotion: Ambulation Ambulation/Gait Assistance: 4: Min assist Locomotion: Ambulation: 4: Travels 150 ft or more with minimal assistance (Pt.>75%)  Comprehension Comprehension Mode:  Auditory Comprehension: 5-Understands complex 90% of the time/Cues < 10% of the time  Expression Expression Mode: Verbal Expression: 5-Expresses complex 90% of the time/cues < 10% of the time  Social Interaction Social Interaction: 6-Interacts appropriately with others with medication or extra time (anti-anxiety, antidepressant).  Problem Solving Problem Solving: 5-Solves complex 90% of the time/cues < 10% of the time  Memory Memory: 5-Recognizes or recalls 90% of the time/requires cueing < 10% of the time Medical Problem List and Plan:  1. DVT Prophylaxis/Anticoagulation: Pharmaceutical: Lovenox  2. Pain Management: Continue decadron--will add pepcid for GI prophylaxis. Prn oxycodone effective.  3. Mood: No signs of distress noted. Will have LCSW follow for evaluation.  4. Neuropsych: This patient is capable of making decisions on her own behalf.  5. Constipation: Augment bowel program.  6. Leucocytosis: Increasing but afebrile. Likely dexamethasone related will wean     LOS (Days) 2 A FACE TO FACE EVALUATION WAS PERFORMED  Tomma Ehinger E 08/15/2013, 3:38 PM

## 2013-08-15 NOTE — Progress Notes (Signed)
Physical Therapy Session Note  Patient Details  Name: Shelia Davis MRN: [id number] Date of Birth: 25-Jan-1951  Today's Date: 08/15/2013 Time: 1500-1530 Time Calculation (min): 30 min  Short Term Goals: Week 1:  PT Short Term Goal 1 (Week 1): = LTGs  Skilled Therapeutic Interventions/Progress Updates:   Session focused on functional mobility and strengthening/endurance gait with RW to/from gym with min A and cues for safety and Nustep x 15 min on level 3 with Bilateral UE's and LE's. Pt completed min A toilet transfers back in the room (required min A due to slight LOB posterior). Positioned back in bed to rest.  Therapy Documentation Precautions:  Precautions Precautions: Cervical Required Braces or Orthoses: Cervical Brace Cervical Brace: Soft collar;For comfort Restrictions Weight Bearing Restrictions: No   Pain: No complaints.  Locomotion : Ambulation Ambulation/Gait Assistance: 4: Min assist    See FIM for current functional status  Therapy/Group: Individual Therapy  Karolee Stamps [facility] 08/15/2013, 4:02 PM

## 2013-08-15 NOTE — IPOC Note (Addendum)
Overall Plan of Care [facility]) Patient Details Name: Shelia Davis MRN: [id number] DOB: 1950/09/21  Admitting Diagnosis: ACDF  Hospital Problems: Principal Problem:   Spondylosis, cervical, with myelopathy Active Problems:   Post op constipation     Functional Problem List: Nursing Sensory;Safety;Pain;Motor  PT Balance;Endurance;Perception;Pain;Safety;Sensory;Skin Integrity  OT Balance;Endurance;Motor;Safety;Sensory  SLP    TR         Basic ADL's: OT Eating;Grooming;Bathing;Dressing;Toileting     Advanced  ADL's: OT Simple Meal Preparation     Transfers: PT Bed Mobility;Bed to Chair;Car;Furniture;Floor  OT Toilet;Tub/Shower     Locomotion: PT Ambulation;Stairs     Additional Impairments: OT    SLP        TR      Anticipated Outcomes Item Anticipated Outcome  Self Feeding Mod I  Swallowing      Basic self-care  Mod I  Toileting  Mod I   Bathroom Transfers Mod I  Bowel/Bladder  Continent of bowel and bladder  Transfers  S overall  Locomotion  S overall gait and stairs  Communication     Cognition     Pain  </=3  Safety/Judgment  No falls with injury   Therapy Plan: PT Intensity: Minimum of 1-2 x/day ,45 to 90 minutes PT Frequency: 5 out of 7 days PT Duration Estimated Length of Stay: 7 days OT Intensity: Minimum of 1-2 x/day, 45 to 90 minutes OT Frequency: 5 out of 7 days OT Duration/Estimated Length of Stay: 7-10 days         Team Interventions: Nursing Interventions    PT interventions Ambulation/gait training;Balance/vestibular training;Cognitive remediation/compensation;Community reintegration;Discharge planning;DME/adaptive equipment instruction;Functional mobility training;Neuromuscular re-education;Pain management;Patient/family education;Psychosocial support;Skin care/wound management;Stair training;Therapeutic Activities;Therapeutic Exercise;UE/LE Strength taining/ROM;UE/LE Coordination activities;Wheelchair propulsion/positioning  OT  Interventions Balance/vestibular training;Discharge planning;Therapeutic Exercise;Therapeutic Activities;UE/LE Coordination activities;UE/LE Strength taining/ROM;DME/adaptive equipment instruction;Self Care/advanced ADL retraining;Patient/family education;Functional mobility training  SLP Interventions    TR Interventions  recreation/leisure participation, pt/family education, therapeutic activities, UE coordination, community reintegration, adaptive equipment instruction  SW/CM Interventions      Team Discharge Planning: Destination: PT-Home ,OT- Home , SLP-  Projected Follow-up: PT-Home health PT;24 hour supervision/assistance, OT-  Home health OT, SLP-  Projected Equipment Needs: PT-Rolling walker with 5" wheels, OT- Tub/shower seat, SLP-  Equipment Details: PT- , OT-  Patient/family involved in discharge planning: PT- Patient,  OT-Patient, SLP-   MD ELOS: 7-9 days Medical Rehab Prognosis:  Excellent Assessment: The patient has been admitted for CIR therapies. The team will be addressing, functional mobility, strength, stamina, balance, safety, adaptive techniques/equipment, self-care, bowel and bladder mgt, patient and caregiver education, pain mgt, surgical precautions. Goals have been set at [facility] for basic self-care, supervision for mobility. She is quite motivated    Ranelle Oyster, MD, [facility]      See Team Conference Notes for weekly updates to the plan of care

## 2013-08-15 NOTE — Progress Notes (Signed)
Occupational Therapy Session Note  Patient Details  Name: Shelia Davis MRN: [id number] Date of Birth: 05/11/1951  Today's Date: 08/15/2013 Time: 0730-0830 Time Calculation (min): 60 min  Short Term Goals: Week 1:  OT Short Term Goal 1 (Week 1): STG=LTG d/t short ELOS  Skilled Therapeutic Interventions: ADL-retraining with emphasis on improved safety awareness, dynamic sitting/standing balance, improved fine motor control of bil UE, and re-ed on use of AE (built-up grip for silverware).   Patient ambulated to her room w/o device with close guard for safety.  Patient completed transfers (toilet/shower) with standby assist and verbal cues for use of grab bars.   Patient able to hold onto wash cloth during this session to bathe upper and lower body without physical assist needed.   Patient completed dressing sitting at edge of bed with min assist to manage bra, sitting and standing for dressing, and demo'd LOB 2X while standing to pull up pants.   Patient required setup assist for self-feeding and verbal cue to use foam tubing to improve prehension of utensils.   Impulsive behaviors and tangential communications continue throughout session  although patient able to receive redirection without complaint.    Therapy Documentation Precautions:  Precautions Precautions: Cervical Required Braces or Orthoses: Cervical Brace Cervical Brace: Soft collar;For comfort Restrictions Weight Bearing Restrictions: No  Pain: 5/10, right shoulder  See FIM for current functional status  Therapy/Group: Individual Therapy  Marypat Kimmet 08/15/2013, 12:32 PM

## 2013-08-16 NOTE — Discharge Summary (Signed)
Physician Discharge Summary  Patient ID: Shelia Davis MRN: [id number] DOB/AGE: 1951/08/12 62 y.o.  Admit date: 08/07/2013 Discharge date: 08/16/2013  Admission Diagnoses:c34 stenosis  Discharge Diagnoses: same Active Problems:   * No active hospital problems. *   Discharged Condition: poor balance. No pain.  Hospital Course: surgery  Consults:rehabilitation medicine  Significant Diagnostic Studies: mri  Treatments:decompression and fusion at c3-4  Discharge Exam: Blood pressure 144/75, pulse 74, temperature 97.5 F (36.4 C), temperature source Oral, resp. rate 20, height 5' 5.5" (1.664 m), weight 97.977 kg (216 lb), SpO2 97.00%. No weakness. Balance getting better  Disposition: rehabilitation medicine   Future Appointments Provider Department Dept Phone   08/17/2013 7:30 AM Donzetta Kohut, OT [facility] [address] [facility] CENTER A [phone]   08/17/2013 9:30 AM Thereasa Parkin, PT [facility] [address] [facility] CENTER A [phone]   08/17/2013 11:00 AM Thereasa Parkin, PT [facility] CENTER A [phone]   08/17/2013 1:45 PM Thereasa Parkin, PT [facility] [address] [facility] CENTER A [phone]       Medication List         alendronate 40 MG tablet  Commonly known as:  FOSAMAX  Take 40 mg by mouth every 7 (seven) days. Take with a full glass of water on an empty stomach.     ergocalciferol 50000 UNITS capsule  Commonly known as:  VITAMIN D2  Take 50,000 Units by mouth once a week.     HYDROcodone-acetaminophen 5-325 MG per tablet  Commonly known as:  NORCO/VICODIN  Take 2 tablets by mouth every 4 (four) hours as needed for pain.     ibuprofen 100 MG tablet  Commonly known as:  ADVIL,MOTRIN  Take 200 mg by mouth every 6 (six) hours as needed for fever.     loratadine 10 MG tablet  Commonly known as:  CLARITIN  Take 10 mg by mouth daily.     ondansetron 4 MG disintegrating tablet  Commonly  known as:  ZOFRAN ODT  Take 1 tablet (4 mg total) by mouth every 8 (eight) hours as needed for nausea.     ondansetron 4 MG disintegrating tablet  Commonly known as:  ZOFRAN ODT  Take 1 tablet (4 mg total) by mouth every 8 (eight) hours as needed for nausea.     ondansetron 4 MG/2ML Soln injection  Commonly known as:  ZOFRAN  Inject 2 mLs (4 mg total) into the vein every 4 (four) hours as needed for nausea or vomiting.     oxyCODONE-acetaminophen 5-325 MG per tablet  Commonly known as:  PERCOCET/ROXICET  Take 1-2 tablets by mouth every 4 (four) hours as needed for moderate pain.     polyethylene glycol packet  Commonly known as:  MIRALAX / GLYCOLAX  Take 17 g by mouth daily.     senna 8.6 MG Tabs tablet  Commonly known as:  SENOKOT  Take 1 tablet (8.6 mg total) by mouth 2 (two) times daily.         Signed: [name] 08/16/2013, 1:02 PM

## 2013-08-16 NOTE — Progress Notes (Signed)
Subjective/Complaints: 62 y.o. female with history of DDD with gait disorder and prior cervical fusion C 4-C6. 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 Jeral Fruit. Post op lethargy imporving but patient continues with poor safety awareness, unsteady gait as well as numbness in hands  Patient feels more warmth in her hands today. Neck pain is well controlled. Eating and drinking well  Review of Systems - Negative except neck pain, warm and cold feeling in hands  Objective: Vital Signs: Blood pressure 154/78, pulse 70, temperature 97.7 F (36.5 C), temperature source Oral, resp. rate 18, weight 94.3 kg (207 lb 14.3 oz), SpO2 96.00%. No results found. Results for orders placed during the hospital encounter of 08/13/13 (from the past 72 hour(s))  URINALYSIS, ROUTINE W REFLEX MICROSCOPIC     Status: None   Collection Time    08/13/13 10:18 PM      Result Value Range   Color, Urine YELLOW  YELLOW   APPearance CLEAR  CLEAR   Specific Gravity, Urine 1.015  1.005 - 1.030   pH 7.0  5.0 - 8.0   Glucose, UA NEGATIVE  NEGATIVE mg/dL   Hgb urine dipstick NEGATIVE  NEGATIVE   Bilirubin Urine NEGATIVE  NEGATIVE   Ketones, ur NEGATIVE  NEGATIVE mg/dL   Protein, ur NEGATIVE  NEGATIVE mg/dL   Urobilinogen, UA 0.2  0.0 - 1.0 mg/dL   Nitrite NEGATIVE  NEGATIVE   Leukocytes, UA NEGATIVE  NEGATIVE   Comment: MICROSCOPIC NOT DONE ON URINES WITH NEGATIVE PROTEIN, BLOOD, LEUKOCYTES, NITRITE, OR GLUCOSE <1000 mg/dL.  URINE CULTURE     Status: None   Collection Time    08/13/13 10:18 PM      Result Value Range   Specimen Description URINE, CLEAN CATCH     Special Requests NONE     Culture  Setup Time       Value: 08/14/2013 05:16     Performed at [facility] Count       Value: NO GROWTH     Performed at [facility]   Culture        Value: NO GROWTH     Performed at [facility]   Report Status 08/15/2013 FINAL    BASIC METABOLIC PANEL     Status: Abnormal   Collection Time    08/14/13 11:56 AM      Result Value Range   Sodium 136  135 - 145 mEq/L   Potassium 4.2  3.5 - 5.1 mEq/L   Chloride 96  96 - 112 mEq/L   CO2 25  19 - 32 mEq/L   Glucose, Bld 167 (*) 70 - 99 mg/dL   BUN 21  6 - 23 mg/dL   Creatinine, Ser 4.78  0.50 - 1.10 mg/dL   Calcium 9.8  8.4 - 29.5 mg/dL   GFR calc non Af Amer >90  >90 mL/min   GFR calc Af Amer >90  >90 mL/min   Comment: (NOTE)     The eGFR has been calculated using the CKD EPI equation.     This calculation has not been validated in all clinical situations.     eGFR's persistently <90 mL/min signify possible Chronic Kidney     Disease.  CBC WITH DIFFERENTIAL     Status: Abnormal   Collection Time  08/14/13 11:56 AM      Result Value Range   WBC 26.2 (*) 4.0 - 10.5 K/uL   RBC 5.62 (*) 3.87 - 5.11 MIL/uL   Hemoglobin 15.8 (*) 12.0 - 15.0 g/dL   HCT 96.0 (*) 45.4 - 09.8 %   MCV 83.5  78.0 - 100.0 fL   MCH 28.1  26.0 - 34.0 pg   MCHC 33.7  30.0 - 36.0 g/dL   RDW 11.9  14.7 - 82.9 %   Platelets 407 (*) 150 - 400 K/uL   Neutrophils Relative % 84 (*) 43 - 77 %   Lymphocytes Relative 9 (*) 12 - 46 %   Monocytes Relative 7  3 - 12 %   Eosinophils Relative 0  0 - 5 %   Basophils Relative 0  0 - 1 %   Neutro Abs 22.0 (*) 1.7 - 7.7 K/uL   Lymphs Abs 2.4  0.7 - 4.0 K/uL   Monocytes Absolute 1.8 (*) 0.1 - 1.0 K/uL   Eosinophils Absolute 0.0  0.0 - 0.7 K/uL   Basophils Absolute 0.0  0.0 - 0.1 K/uL   RBC Morphology POLYCHROMASIA PRESENT       HEENT: normal and cervical collar Cardio: RRR and no murmurs Resp: CTA B/L and unlabored GI: BS positive and non tender, non distended Extremity:  Pulses positive and No Edema Skin:   Intact and Wound C/D/I and Left ant neck Neuro: Alert/Oriented, Cranial Nerve II-XII normal, Abnormal Sensory reduced sensation to LT left C6,7,8  dermatomes, Abnormal Motor 3-/5 Bilateral delt bi tri grip, 4- BLE and Abnormal FMC Ataxic/ dec FMC Musc/Skel:  Neck tender Gen NAD   Assessment/Plan: 1. 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 Physiatrist is providing close team supervision and 24 hour management of active medical problems listed below. Physiatrist and rehab team continue to assess barriers to discharge/monitor patient progress toward functional and medical goals. FIM: FIM - Bathing Bathing Steps Patient Completed: Chest;Right Arm;Left Arm;Abdomen;Front perineal area;Buttocks;Right upper leg;Left upper leg;Right lower leg (including foot);Left lower leg (including foot) Bathing: 5: Supervision: Safety issues/verbal cues  FIM - Upper Body Dressing/Undressing Upper body dressing/undressing steps patient completed: Thread/unthread right bra strap;Thread/unthread left bra strap;Thread/unthread right sleeve of pullover shirt/dresss;Thread/unthread left sleeve of pullover shirt/dress;Put head through opening of pull over shirt/dress;Pull shirt over trunk Upper body dressing/undressing: 4: Min-Patient completed 75 plus % of tasks FIM - Lower Body Dressing/Undressing Lower body dressing/undressing steps patient completed: Thread/unthread right underwear leg;Thread/unthread left underwear leg;Pull underwear up/down;Thread/unthread right pants leg;Thread/unthread left pants leg;Pull pants up/down;Don/Doff right sock;Don/Doff left sock Lower body dressing/undressing: 4: Min-Patient completed 75 plus % of tasks  FIM - Toileting Toileting steps completed by patient: Adjust clothing prior to toileting;Performs perineal hygiene;Adjust clothing after toileting Toileting: 5: Supervision: Safety issues/verbal cues  FIM - Archivist Transfers Assistive Devices: Art gallery manager  Transfers: 4-To toilet/BSC: Min A (steadying Pt. > 75%);4-From toilet/BSC: Min A (steadying Pt. > 75%)  FIM - Bed/Chair Transfer Bed/Chair Transfer Assistive Devices: Therapist, occupational: 5: Supine > Sit: Supervision (verbal cues/safety issues);4: Bed > Chair or W/C: Min A (steadying Pt. > 75%);4: Chair or W/C > Bed: Min A (steadying Pt. > 75%);5: Sit > Supine: Supervision (verbal cues/safety issues)  FIM - Locomotion: Wheelchair Locomotion: Wheelchair: 1: Total Assistance/staff pushes wheelchair (Pt<25%) (gait to be primary means) FIM - Locomotion: Ambulation Ambulation/Gait Assistance: 4: Min assist  Locomotion: Ambulation: 4: Travels 150 ft or more with minimal assistance (Pt.>75%)  Comprehension Comprehension Mode: Auditory Comprehension: 5-Understands complex 90% of the time/Cues < 10% of the time  Expression Expression Mode: Verbal Expression: 5-Expresses complex 90% of the time/cues < 10% of the time  Social Interaction Social Interaction: 6-Interacts appropriately with others with medication or extra time (anti-anxiety, antidepressant).  Problem Solving Problem Solving: 5-Solves complex 90% of the time/cues < 10% of the time  Memory Memory: 5-Recognizes or recalls 90% of the time/requires cueing < 10% of the time Medical Problem List and Plan:  1. DVT Prophylaxis/Anticoagulation: Pharmaceutical: Lovenox  2. Pain Management: Continue decadron--will add pepcid for GI prophylaxis. Prn oxycodone effective.  3. Mood: No signs of distress noted. Will have LCSW follow for evaluation.  4. Neuropsych: This patient is capable of making decisions on her own behalf.  5. Constipation: Augment bowel program.  6. Leucocytosis: Increasing but afebrile. Likely dexamethasone related will wean  7. No evidence of neurogenic bowel or bladder   LOS (Days) 3 A FACE TO FACE EVALUATION WAS PERFORMED  Daleah Coulson E 08/16/2013, 10:27 AM

## 2013-08-17 ENCOUNTER — Inpatient Hospital Stay (HOSPITAL_COMMUNITY): Payer: 59 | Admitting: Physical Therapy

## 2013-08-17 ENCOUNTER — Inpatient Hospital Stay (HOSPITAL_COMMUNITY): Payer: 59

## 2013-08-17 LAB — CBC WITH DIFFERENTIAL/PLATELET
Basophils Absolute: 0 10*3/uL (ref 0.0–0.1)
Basophils Relative: 0 % (ref 0–1)
Eosinophils Absolute: 0 10*3/uL (ref 0.0–0.7)
HCT: 45.1 % (ref 36.0–46.0)
Hemoglobin: 15.4 g/dL — ABNORMAL HIGH (ref 12.0–15.0)
MCH: 28.6 pg (ref 26.0–34.0)
MCHC: 34.1 g/dL (ref 30.0–36.0)
Monocytes Absolute: 1.7 10*3/uL — ABNORMAL HIGH (ref 0.1–1.0)
Monocytes Relative: 7 % (ref 3–12)
Neutrophils Relative %: 78 % — ABNORMAL HIGH (ref 43–77)
Platelets: 394 10*3/uL (ref 150–400)

## 2013-08-17 LAB — BASIC METABOLIC PANEL
BUN: 21 mg/dL (ref 6–23)
Chloride: 97 mEq/L (ref 96–112)
Creatinine, Ser: 0.68 mg/dL (ref 0.50–1.10)
GFR calc Af Amer: >90 mL/min (ref >90)
GFR calc non Af Amer: >90 mL/min (ref >90)
Potassium: 4.1 mEq/L (ref 3.5–5.1)
Sodium: 137 mEq/L (ref 135–145)

## 2013-08-17 MED ORDER — DEXAMETHASONE 4 MG PO TABS
4.0000 mg | ORAL_TABLET | Freq: Two times a day (BID) | ORAL | Status: DC
  Administered 2013-08-18 – 2013-08-20 (×6): 4 mg via ORAL
  Filled 2013-08-17 (×9): qty 1

## 2013-08-17 NOTE — Progress Notes (Signed)
Subjective/Complaints: 62 y.o. female with history of DDD with gait disorder and prior cervical fusion C 4-C6. 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 Jeral Fruit. Post op lethargy imporving but patient continues with poor safety awareness, unsteady gait as well as numbness in hands  Appreciate psychology note. Patient with cognitive dysfunction. Also question mood disorder. Her son this has started recently. She is currently on dexamethasone 4 mg 3 times a day Eating and drinking well  Review of Systems - Negative except neck pain, warm and cold feeling in hands  Objective: Vital Signs: Blood pressure 149/75, pulse 66, temperature 98 F (36.7 C), temperature source Oral, resp. rate 18, weight 94.3 kg (207 lb 14.3 oz), SpO2 93.00%. No results found. Results for orders placed during the hospital encounter of 08/13/13 (from the past 72 hour(s))  CBC WITH DIFFERENTIAL     Status: Abnormal   Collection Time    08/17/13  5:09 AM      Result Value Range   WBC 26.5 (*) 4.0 - 10.5 K/uL   RBC 5.38 (*) 3.87 - 5.11 MIL/uL   Hemoglobin 15.4 (*) 12.0 - 15.0 g/dL   HCT 40.9  81.1 - 91.4 %   MCV 83.8  78.0 - 100.0 fL   MCH 28.6  26.0 - 34.0 pg   MCHC 34.1  30.0 - 36.0 g/dL   RDW 78.2  95.6 - 21.3 %   Platelets 394  150 - 400 K/uL   Neutrophils Relative % 78 (*) 43 - 77 %   Neutro Abs 20.5 (*) 1.7 - 7.7 K/uL   Lymphocytes Relative 16  12 - 46 %   Lymphs Abs 4.2 (*) 0.7 - 4.0 K/uL   Monocytes Relative 7  3 - 12 %   Monocytes Absolute 1.7 (*) 0.1 - 1.0 K/uL   Eosinophils Relative 0  0 - 5 %   Eosinophils Absolute 0.0  0.0 - 0.7 K/uL   Basophils Relative 0  0 - 1 %   Basophils Absolute 0.0  0.0 - 0.1 K/uL  BASIC METABOLIC PANEL     Status: Abnormal   Collection Time    08/17/13  8:07 AM      Result Value Range   Sodium 137  135 - 145 mEq/L    Potassium 4.1  3.5 - 5.1 mEq/L   Chloride 97  96 - 112 mEq/L   CO2 26  19 - 32 mEq/L   Glucose, Bld 119 (*) 70 - 99 mg/dL   BUN 21  6 - 23 mg/dL   Creatinine, Ser 0.86  0.50 - 1.10 mg/dL   Calcium 9.1  8.4 - 57.8 mg/dL   GFR calc non Af Amer >90  >90 mL/min   GFR calc Af Amer >90  >90 mL/min   Comment: (NOTE)     The eGFR has been calculated using the CKD EPI equation.     This calculation has not been validated in all clinical situations.     eGFR's persistently <90 mL/min signify possible Chronic Kidney     Disease.     HEENT: normal and cervical collar Cardio: RRR and no murmurs Resp: CTA B/L and unlabored GI: BS positive and non tender, non distended Extremity:  Pulses positive and No Edema Skin:   Intact and Wound C/D/I and Left ant neck Neuro: Alert/Oriented, Cranial Nerve  II-XII normal, Abnormal Sensory reduced sensation to LT left C6,7,8 dermatomes, Abnormal Motor 3-/5 Bilateral delt bi tri grip, 4- BLE and Abnormal FMC Ataxic/ dec FMC Musc/Skel:  Neck tender Gen NAD   Assessment/Plan: 1. 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 Physiatrist is providing close team supervision and 24 hour management of active medical problems listed below. Physiatrist and rehab team continue to assess barriers to discharge/monitor patient progress toward functional and medical goals. FIM: FIM - Bathing Bathing Steps Patient Completed: Chest;Right Arm;Left Arm;Abdomen;Front perineal area;Buttocks;Right upper leg;Left upper leg;Right lower leg (including foot);Left lower leg (including foot) Bathing: 5: Supervision: Safety issues/verbal cues  FIM - Upper Body Dressing/Undressing Upper body dressing/undressing steps patient completed: Thread/unthread right bra strap;Thread/unthread left bra strap;Thread/unthread right sleeve of pullover  shirt/dresss;Thread/unthread left sleeve of pullover shirt/dress;Put head through opening of pull over shirt/dress Upper body dressing/undressing: 4: Min-Patient completed 75 plus % of tasks FIM - Lower Body Dressing/Undressing Lower body dressing/undressing steps patient completed: Thread/unthread right underwear leg;Thread/unthread left underwear leg;Thread/unthread right pants leg;Thread/unthread left pants leg;Don/Doff right sock;Don/Doff left sock;Don/Doff right shoe;Don/Doff left shoe Lower body dressing/undressing: 3: Mod-Patient completed 50-74% of tasks  FIM - Toileting Toileting steps completed by patient: Adjust clothing prior to toileting;Performs perineal hygiene;Adjust clothing after toileting Toileting: 5: Supervision: Safety issues/verbal cues  FIM - Archivist Transfers Assistive Devices: Art gallery manager Transfers: 4-To toilet/BSC: Min A (steadying Pt. > 75%);4-From toilet/BSC: Min A (steadying Pt. > 75%)  FIM - Bed/Chair Transfer Bed/Chair Transfer Assistive Devices: Therapist, occupational: 5: Supine > Sit: Supervision (verbal cues/safety issues);4: Bed > Chair or W/C: Min A (steadying Pt. > 75%);4: Chair or W/C > Bed: Min A (steadying Pt. > 75%);5: Sit > Supine: Supervision (verbal cues/safety issues)  FIM - Locomotion: Wheelchair Locomotion: Wheelchair: 0: Activity did not occur FIM - Locomotion: Ambulation Ambulation/Gait Assistance: 4: Min assist Locomotion: Ambulation: 0: Activity did not occur  Comprehension Comprehension Mode: Auditory Comprehension: 5-Understands complex 90% of the time/Cues < 10% of the time  Expression Expression Mode: Verbal Expression: 5-Expresses complex 90% of the time/cues < 10% of the time  Social Interaction Social Interaction: 6-Interacts appropriately with others with medication or extra time (anti-anxiety, antidepressant).  Problem Solving Problem Solving: 5-Solves complex 90% of the time/cues < 10% of the  time  Memory Memory: 5-Recognizes or recalls 90% of the time/requires cueing < 10% of the time Medical Problem List and Plan:  1. DVT Prophylaxis/Anticoagulation: Pharmaceutical: Lovenox  2. Pain Management: Continue decadron--will add pepcid for GI prophylaxis. Prn oxycodone effective.  3. Mood: On dexamethasone taper, would not start antidepressant at this time. Reassess after dexamethasone has been weaned. Will reduce 2 twice a day tomorrow 4. Neuropsych: This patient is capable of making decisions on her own behalf.  5. Constipation: Augment bowel program.  6. Leucocytosis: Increasing but afebrile. Likely dexamethasone related will wean  7. No evidence of neurogenic bowel or bladder   LOS (Days) 4 A FACE TO FACE EVALUATION WAS PERFORMED  Kelwin Gibler E 08/17/2013, 12:09 PM

## 2013-08-17 NOTE — Progress Notes (Signed)
Occupational Therapy Session Note  Patient Details  Name: Shelia Davis MRN: [id number] Date of Birth: [birth year]  Today's Date: 08/17/2013 Time: 0730-0830 Time Calculation (min): 60 min  Short Term Goals: Week 1:  OT Short Term Goal 1 (Week 1): STG=LTG d/t short ELOS  Skilled Therapeutic Interventions/Progress Updates: ADL-retraining with emphasis on improved safety awareness, sequencing, short-term memory, dynamic standing balance, and endurance.   Patient completed functional mobility in her room using RW per her preference due to fear of falling.   Patient ambulated with RW recklessly and required close supervision and verbal cues to slow down and to avoid obstacles.   Patient performed transfer to tub bench and seated bathing with only supervision assist.   Patient prefers dressing at edge of bed and required only min assist to don sports bra, with steadying assist during dynamic standing while pulling up her pants. During conversation patient requires mod assist to sustain attention to questions and answers on topic before changing topics and she repeated similar questions without awareness (short-term memory impairment).   Patient acknowledged need to talk to social worker about possible application for SSDI due to loss of fine motor control at bil hands and impaired attention.   Patient required setup assist to cut food and open containers for self-feeding of breakfast meal.   After 60 minutes of ADL patient admitted to fatigue and need for rest prior to next therapy session.       Therapy Documentation Precautions:  Precautions Precautions: Cervical Required Braces or Orthoses: Cervical Brace Cervical Brace: Soft collar;For comfort Restrictions Weight Bearing Restrictions: No Vital Signs: Therapy Vitals Temp: 98 F (36.7 C) Temp src: Oral Pulse Rate: 66 Resp: 18 BP: 149/75 mmHg Patient Position, if appropriate: Lying Oxygen Therapy SpO2: 93 % O2 Device: None (Room  air) Pain: Pain Assessment Pain Assessment: No/denies pain  See FIM for current functional status  Therapy/Group: Individual Therapy  Lysandra Loughmiller 08/17/2013, 8:26 AM

## 2013-08-17 NOTE — Progress Notes (Signed)
Physical Therapy Session Note  Patient Details  Name: Shelia Davis MRN: [id number] Date of Birth: Dec 01, 1950  Today's Date: 08/17/2013 First SessionTime:0935  -1030  Total Time: 55 min Second SessionTime:1100-1155  Total Time: 55 min Third SessionTime: 1350-1440 Total Time: 50 min    Short Term Goals: Week 1:  PT Short Term Goal 1 (Week 1): = LTGs  First session- Skilled Therapeutic Interventions/Progress Updates:    Ambulation x >150' with RW and min-guard assist cues for safety with walker, visual scanning (without cervical rotation). Home environment ambulation, min assist cues for safety with RW. Discussed with pt and education pt on need for no ambulation when by self, need to rely on wheelchair mobility in the home when by self. Practiced wheelchair mobility in home environment with supervision - focus on set-up for transfers to/from low couch (x 5 reps), bed (x 3 reps), and 3n1. Increased time allowed for pt to problem solve and for planned failure. Max progressing to min verbal cues, (initially backed wheelchair perpendicular to transfer surface with no self-correction, progressed to only needing cues for RW/brakes/spacing). Will continue to benefit from repetition.   Second Session- Skilled Therapeutic Interventions/Progress Updates:  Dynamic balance with ambulation with RW min-guard assist, PT facilitating Lt. Knee stability. Tall kneeling for core stability and trunk proprioception, increased challenge with small reaching task. Standing dynamic and static balance on compliant surface working towards modified single limb stance. Bathroom mobility with RW, mod assist for one loss of balance in bathroom due to uneven floor and poor management of RW.   Third Session- Skilled Therapeutic Interventions/Progress Updates: Session focused on safety with wheelchair level mobility in home, wheelchair set-up, management of RW and transfers to/from furniture and bedside commode with RW to prepare  for modified independent status and assess recall of first therapy session. Pt continues to require min to mod verbal cues for set-up and management of RW however does demonstrate ~40% recall from first session. She will benefit from more repetition to reach modified independent status and obtain a level safe in the home.   Pt also continues to have Lt. Knee hyperextension, will call for prosthetics evaluation to decrease joint degradation.    Therapy Documentation Precautions:  Precautions Precautions: Cervical Required Braces or Orthoses: Cervical Brace Cervical Brace: Soft collar;For comfort Restrictions Weight Bearing Restrictions: No  See FIM for current functional status  Therapy/Group: Individual Therapy with all sessions  Wilhemina Bonito 08/17/2013, 7:01 AM

## 2013-08-18 ENCOUNTER — Inpatient Hospital Stay (HOSPITAL_COMMUNITY): Payer: 59 | Admitting: Physical Therapy

## 2013-08-18 MED ORDER — TRAMADOL HCL 50 MG PO TABS
50.0000 mg | ORAL_TABLET | Freq: Four times a day (QID) | ORAL | Status: DC | PRN
  Administered 2013-08-23: 50 mg via ORAL
  Filled 2013-08-18: qty 1

## 2013-08-18 MED ORDER — TRAZODONE HCL 50 MG PO TABS
50.0000 mg | ORAL_TABLET | Freq: Every evening | ORAL | Status: DC | PRN
  Filled 2013-08-18: qty 1

## 2013-08-18 NOTE — Progress Notes (Signed)
Physical Therapy Note  Patient Details  Name: Shelia Davis MRN: [id number] Date of Birth: May 13, 1951 Today's Date: 08/18/2013  1100-1155 (55 minutes) group Pain: no reported pain Pt participated in PT group session focused on gait training/safety/endurance. Pt ambulates 200 feet + RW SBA for safety secondary to decreased standing balance; gait 80 feet without AD min to close SBA ; Kinetron in sitting X 20 reps , in standing 2 X 15 reps with bilateral UE assist; standing with one foot on 4 inch step with decreased balance to left (min assist) .    Zayn Selley,JIM 08/18/2013, 12:01 PM

## 2013-08-18 NOTE — Patient Care Conference (Signed)
Inpatient RehabilitationTeam Conference and Plan of Care Update Date: 08/14/2013   Time: 2:10 PM    Patient Name: Shelia Davis      Medical Record Number: [id number]  Date of Birth: May 12, 1951 Sex: Female         Room/Bed: 4W21C/4W21C-01 Payor Info: Payor: Advertising copywriter / Plan: Advertising copywriter / Product Type: *No Product type* /    Admitting Diagnosis: ACDF  Admit Date/Time:  08/13/2013  4:20 PM Admission Comments: No comment available   Primary Diagnosis:  Spondylosis, cervical, with myelopathy Principal Problem: Spondylosis, cervical, with myelopathy  Patient Active Problem List   Diagnosis Date Noted  . Spondylosis, cervical, with myelopathy 08/14/2013  . Post op constipation 08/14/2013    Expected Discharge Date: Expected Discharge Date: 08/21/13  Team Members Present: Physician leading conference: Dr. Claudette Laws Social Worker Present: Amada Jupiter, LCSW Nurse Present: Carlean Purl, RN PT Present: Karolee Stamps, PT OT Present: Mackie Pai, OT;Patricia Mat Carne, OT PPS Coordinator present : Tora Duck, RN, CRRN     Current Status/Progress Goal Weekly Team Focus  Medical   Upper ext weakness and numbness, constipation, not using hi dose pain med  bowel program  initiate program   Bowel/Bladder   continent of bowel and bladder; urinary urgency; LBM 11/24- pt feels constipated  continent of bowel and bladder      Swallow/Nutrition/ Hydration             ADL's   Supervision for transfers, Mod A for ADL  Mod I  bil hand strengthening, fine motor control, improved awareness and attention, AE training   Mobility   min A overll  S overall  safety awareness, balance, strength, endurance, gait   Communication             Safety/Cognition/ Behavioral Observations            Pain   pain in neck managed with percocet  </=3      Skin   incision to left neck with steris intact; bruise to right arm  No new skin breakdown       Rehab Goals Patient on target to  meet rehab goals: Yes *See Care Plan and progress notes for long and short-term goals.  Barriers to Discharge: some confusion, needs physical assist ADLs    Possible Resolutions to Barriers:  check meds    Discharge Planning/Teaching Needs:  home with husband who can take a few days off from work, however, pt may require more supervision than initially anticipated.      Team Discussion:  Wound healing well. Cont b/b;  Odd behavioral presentation and do not feel med related.  ?baseline function.  Currently light minimal assist with mobility and mod assist ADLs.  Impulsive.  Supervision goals overall.  Revisions to Treatment Plan:  None   Continued Need for Acute Rehabilitation Level of Care: The patient requires daily medical management by a physician with specialized training in physical medicine and rehabilitation for the following conditions: Daily direction of a multidisciplinary physical rehabilitation program to ensure safe treatment while eliciting the highest outcome that is of practical value to the patient.: Yes Daily medical management of patient stability for increased activity during participation in an intensive rehabilitation regime.: Yes Daily analysis of laboratory values and/or radiology reports with any subsequent need for medication adjustment of medical intervention for : Neurological problems;Post surgical problems  Nile Prisk 08/18/2013, 12:50 PM

## 2013-08-18 NOTE — Progress Notes (Addendum)
Social Work Patient ID: Shelia Davis, female   DOB: 05-19-1951, 62 y.o.   MRN: [id number]  Met with pt and spoke with husband via phone following team conference.  Both aware of targeted d/c date of 12/2 and of current supervision goals.  Husband cannot provide 24/7 supervision beyond a few days.  He is hopeful that her cognition will improve and questions if might be med related.  He also feels that she is depressed and this may be affecting her cognition.  Neuropsych has seen and made recommendations.  Will follow up with pt and husband on Monday to determine any change and discuss d/c plans further.  Gitty Osterlund, LCSW   PLEASE NOTE THAT FOLLOWING DISCUSSION WITH PT AND HUSBAND - TEAM AGREED IN CHANGE OF D/C DATE TO 08/24/13 WITH PLAN TO DISCUSS PROGRESS AGAIN ON MONDAY

## 2013-08-18 NOTE — Progress Notes (Signed)
Subjective/Complaints: 62 y.o. female with history of DDD with gait disorder and prior cervical fusion C 4-C6. 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 Jeral Fruit. Post op lethargy imporving but patient continues with poor safety awareness, unsteady gait as well as numbness in hands  Had some neck pain yesterday did not wear her brace Eating and drinking well  Review of Systems - Negative except neck pain, warm and cold feeling in hands  Objective: Vital Signs: Blood pressure 158/88, pulse 67, temperature 98 F (36.7 C), temperature source Oral, resp. rate 18, weight 94.3 kg (207 lb 14.3 oz), SpO2 97.00%. No results found. Results for orders placed during the hospital encounter of 08/13/13 (from the past 72 hour(s))  CBC WITH DIFFERENTIAL     Status: Abnormal   Collection Time    08/17/13  5:09 AM      Result Value Range   WBC 26.5 (*) 4.0 - 10.5 K/uL   RBC 5.38 (*) 3.87 - 5.11 MIL/uL   Hemoglobin 15.4 (*) 12.0 - 15.0 g/dL   HCT 16.1  09.6 - 04.5 %   MCV 83.8  78.0 - 100.0 fL   MCH 28.6  26.0 - 34.0 pg   MCHC 34.1  30.0 - 36.0 g/dL   RDW 40.9  81.1 - 91.4 %   Platelets 394  150 - 400 K/uL   Neutrophils Relative % 78 (*) 43 - 77 %   Neutro Abs 20.5 (*) 1.7 - 7.7 K/uL   Lymphocytes Relative 16  12 - 46 %   Lymphs Abs 4.2 (*) 0.7 - 4.0 K/uL   Monocytes Relative 7  3 - 12 %   Monocytes Absolute 1.7 (*) 0.1 - 1.0 K/uL   Eosinophils Relative 0  0 - 5 %   Eosinophils Absolute 0.0  0.0 - 0.7 K/uL   Basophils Relative 0  0 - 1 %   Basophils Absolute 0.0  0.0 - 0.1 K/uL  BASIC METABOLIC PANEL     Status: Abnormal   Collection Time    08/17/13  8:07 AM      Result Value Range   Sodium 137  135 - 145 mEq/L   Potassium 4.1  3.5 - 5.1 mEq/L   Chloride 97  96 - 112 mEq/L   CO2 26  19 - 32 mEq/L   Glucose, Bld 119 (*) 70 - 99 mg/dL   BUN 21   6 - 23 mg/dL   Creatinine, Ser 7.82  0.50 - 1.10 mg/dL   Calcium 9.1  8.4 - 95.6 mg/dL   GFR calc non Af Amer >90  >90 mL/min   GFR calc Af Amer >90  >90 mL/min   Comment: (NOTE)     The eGFR has been calculated using the CKD EPI equation.     This calculation has not been validated in all clinical situations.     eGFR's persistently <90 mL/min signify possible Chronic Kidney     Disease.     HEENT: normal and cervical collar Cardio: RRR and no murmurs Resp: CTA B/L and unlabored GI: BS positive and non tender, non distended Extremity:  Pulses positive and No Edema Skin:   Intact and Wound C/D/I and Left ant neck, Steri-Strips peeling off. No evidence of drainage Neuro: Alert/Oriented, Cranial Nerve II-XII normal, Abnormal Sensory reduced sensation to LT left C6,7,8 dermatomes,  Abnormal Motor 3-/5 Bilateral delt bi tri grip, 4- BLE and Abnormal FMC Ataxic/ dec FMC Musc/Skel:  Neck tender posteriorly but not anteriorly Gen NAD   Assessment/Plan: 1. 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 Physiatrist is providing close team supervision and 24 hour management of active medical problems listed below. Physiatrist and rehab team continue to assess barriers to discharge/monitor patient progress toward functional and medical goals. FIM: FIM - Bathing Bathing Steps Patient Completed: Chest;Right Arm;Left Arm;Abdomen;Front perineal area;Buttocks;Right upper leg;Left upper leg;Right lower leg (including foot);Left lower leg (including foot) Bathing: 5: Supervision: Safety issues/verbal cues  FIM - Upper Body Dressing/Undressing Upper body dressing/undressing steps patient completed: Thread/unthread right bra strap;Thread/unthread left bra strap;Thread/unthread right sleeve of pullover shirt/dresss;Thread/unthread left sleeve of pullover  shirt/dress;Put head through opening of pull over shirt/dress Upper body dressing/undressing: 4: Min-Patient completed 75 plus % of tasks FIM - Lower Body Dressing/Undressing Lower body dressing/undressing steps patient completed: Thread/unthread right underwear leg;Thread/unthread left underwear leg;Thread/unthread right pants leg;Thread/unthread left pants leg;Don/Doff right sock;Don/Doff left sock;Don/Doff right shoe;Don/Doff left shoe Lower body dressing/undressing: 3: Mod-Patient completed 50-74% of tasks  FIM - Toileting Toileting steps completed by patient: Adjust clothing prior to toileting;Performs perineal hygiene;Adjust clothing after toileting Toileting: 5: Supervision: Safety issues/verbal cues  FIM - Archivist Transfers Assistive Devices: Art gallery manager Transfers: 5-To toilet/BSC: Supervision (verbal cues/safety issues);4-From toilet/BSC: Min A (steadying Pt. > 75%)  FIM - Bed/Chair Transfer Bed/Chair Transfer Assistive Devices: Therapist, occupational: 5: Supine > Sit: Supervision (verbal cues/safety issues);4: Bed > Chair or W/C: Min A (steadying Pt. > 75%);4: Chair or W/C > Bed: Min A (steadying Pt. > 75%);5: Sit > Supine: Supervision (verbal cues/safety issues)  FIM - Locomotion: Wheelchair Locomotion: Wheelchair: 2: Travels 50 - 149 ft with supervision, cueing or coaxing FIM - Locomotion: Ambulation Locomotion: Ambulation Assistive Devices: Designer, industrial/product Ambulation/Gait Assistance: 4: Min guard Locomotion: Ambulation: 4: Travels 150 ft or more with minimal assistance (Pt.>75%)  Comprehension Comprehension Mode: Auditory Comprehension: 5-Understands complex 90% of the time/Cues < 10% of the time  Expression Expression Mode: Verbal Expression: 5-Expresses complex 90% of the time/cues < 10% of the time  Social Interaction Social Interaction: 6-Interacts appropriately with others with medication or extra time (anti-anxiety,  antidepressant).  Problem Solving Problem Solving: 5-Solves complex 90% of the time/cues < 10% of the time  Memory Memory: 5-Recognizes or recalls 90% of the time/requires cueing < 10% of the time Medical Problem List and Plan:  1. DVT Prophylaxis/Anticoagulation: Pharmaceutical: Lovenox  2. Pain Management: Continue decadron--will add pepcid for GI prophylaxis. Prn oxycodone effective.  3. Mood: On dexamethasone taper, would not start antidepressant at this time. Reassess after dexamethasone has been weaned. Will reduce dexamethasone to  twice a day today 4. Neuropsych: This patient is capable of making decisions on her own behalf.  5. Constipation: Augment bowel program.  6. Leucocytosis: Increasing but afebrile. Likely dexamethasone related will wean  7. No evidence of neurogenic bowel or bladder   LOS (Days) 5 A FACE TO FACE EVALUATION WAS PERFORMED  Nachelle Negrette E 08/18/2013, 10:46 AM

## 2013-08-18 NOTE — Progress Notes (Addendum)
Social Work  Social Work Assessment and Plan  Patient Details  Name: Shelia Davis MRN: [id number] Date of Birth: [birth year]  Today's Date: 08/15/2013  Problem List:  Patient Active Problem List   Diagnosis Date Noted  . Spondylosis, cervical, with myelopathy 08/14/2013  . Post op constipation 08/14/2013   Past Medical History:  Past Medical History  Diagnosis Date  . Osteoarthritis   . PONV (postoperative nausea and vomiting)    Past Surgical History:  Past Surgical History  Procedure Laterality Date  . Cholecystectomy    . Abdominal hysterectomy    . Neck fusion    . Falls      numerous breaks from falls,wrist X3, fingers,lt leg,ankle,foot  . Fracture surgery      freq. falls  . Anterior cervical decomp/discectomy fusion N/A 08/07/2013    Procedure: CERVICAL THREE-FOUR ANTERIOR CERVICAL DECOMPRESSION/DISCECTOMY FUSION 1 LEVEL;  Surgeon: Shelia Cassis, MD;  Location: [facility];  Service: Neurosurgery;  Laterality: N/A;  C3-4 Anterior cervical decompression/diskectomy/fusion   Social History:  reports that she has never smoked. She does not have any smokeless tobacco history on file. She reports that she does not drink alcohol or use illicit drugs.  Family / Support Systems Marital Status: Married How Long?: 47 yrs Patient Roles: Spouse;Parent (Has 1 daughter and 2 sons locally.) Spouse/Significant Other: husband, Shelia Davis @ (H) [phone] or [phone] Children: they have 3 adult children (daughter and two sons) who are all living locally but working full time and with families Anticipated Caregiver: self and husband at evenings Ability/Limitations of Caregiver: Husband works days 6 am to 5 pm (Sons work.  Dtr cares for patient's father.) Caregiver Availability: Evenings only Family Dynamics: pt describes good relationship with husband and children, however, very reluctant to commit children to any level of assistance  Social History Preferred language:  English Religion: Baptist Cultural Background: NA Education: HS Read: Yes Write: Yes Employment Status: Employed Name of Employer: [employer] Length of Employment: 20 (yrs) Return to Work Plans: pt very hopeful she can return to work, however, majority of her day spent documenting on Mining engineer Issues: None Guardian/Conservator: None   Abuse/Neglect Physical Abuse: Denies Verbal Abuse: Denies Sexual Abuse: Denies Exploitation of patient/patient's resources: Denies Self-Neglect: Denies  Emotional Status Pt's affect, behavior adn adjustment status: Pt pleasant and agreeable to interview, however, attention was poor.  Quickly moves from question to question and required redirection at times. Denies any significant emotional distress very quickly.  Admits frustration wtih limitations and concern how this might affect her return to work.  Requesting neuropsych eval for mood and cognition eval.  Monitor. Recent Psychosocial Issues: None per pt Pyschiatric History: None per pt and husband Substance Abuse History: None  Patient / Family Perceptions, Expectations & Goals Pt/Family understanding of illness & functional limitations: pt and husband with basic understanding of surgery performed and current functional limitations. Premorbid pt/family roles/activities: pt and husband note pt was completely independent PTA and working f/t. Anticipated changes in roles/activities/participation: Pt may very well require supervision and some assistance at d/c if cognition doesn't improve as well as UE use.  Concern that pt may require 24/7 supervision which family cannot provide beyond a few days. Pt/family expectations/goals: Both pt and husband hopeful she can reach mod i goals and eventually be able to return to work.  Community Resources [employer]: None Premorbid Home Care/DME Agencies: None Transportation available at discharge: yes Resource referrals  recommended: Neuropsychology  Discharge Planning  Living Arrangements: Spouse/significant other Support Systems: Spouse/significant other;Children Type of Residence: Private residence Insurance Resources: Media planner (specify) Education officer, museum) Financial Resources: Employment Financial Screen Referred: No Living Expenses: Database administrator Management: Spouse Does the patient have any problems obtaining your medications?: No Home Management: shared with pt and husband Patient/Family Preliminary Plans: pt plans to return home with husband as primary support, however, he cannot provide 24/7 assistance longer than a few days Barriers to Discharge: Self care;Family Support Social Work Anticipated Follow Up Needs: HH/OP Expected length of stay: 8-10 days  Clinical Impression Pleasant woman here following back surgery but now also presenting with some poor cognition and family notes this is NOT her baseline function.  Husband questions if related to pain meds. Will monitor as this could become problematic if prevents her from reaching mod i goals.  Shelia Davis 08/15/2013, 1:05 PM

## 2013-08-19 ENCOUNTER — Inpatient Hospital Stay (HOSPITAL_COMMUNITY): Payer: 59 | Admitting: *Deleted

## 2013-08-19 ENCOUNTER — Inpatient Hospital Stay (HOSPITAL_COMMUNITY): Payer: 59

## 2013-08-19 NOTE — Progress Notes (Signed)
Subjective/Complaints: 62 y.o. female with history of DDD with gait disorder and prior cervical fusion C 4-C6. 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 Jeral Fruit. Post op lethargy imporving but patient continues with poor safety awareness, unsteady gait as well as numbness in hands  no neck pain  Eating and drinking well, took shower with OT today  Review of Systems - Negative except neck pain, warm and cold feeling in hands  Objective: Vital Signs: Blood pressure 149/84, pulse 64, temperature 96.8 F (36 C), temperature source Oral, resp. rate 16, weight 94.3 kg (207 lb 14.3 oz), SpO2 94.00%. No results found. Results for orders placed during the hospital encounter of 08/13/13 (from the past 72 hour(s))  CBC WITH DIFFERENTIAL     Status: Abnormal   Collection Time    08/17/13  5:09 AM      Result Value Range   WBC 26.5 (*) 4.0 - 10.5 K/uL   RBC 5.38 (*) 3.87 - 5.11 MIL/uL   Hemoglobin 15.4 (*) 12.0 - 15.0 g/dL   HCT 40.9  81.1 - 91.4 %   MCV 83.8  78.0 - 100.0 fL   MCH 28.6  26.0 - 34.0 pg   MCHC 34.1  30.0 - 36.0 g/dL   RDW 78.2  95.6 - 21.3 %   Platelets 394  150 - 400 K/uL   Neutrophils Relative % 78 (*) 43 - 77 %   Neutro Abs 20.5 (*) 1.7 - 7.7 K/uL   Lymphocytes Relative 16  12 - 46 %   Lymphs Abs 4.2 (*) 0.7 - 4.0 K/uL   Monocytes Relative 7  3 - 12 %   Monocytes Absolute 1.7 (*) 0.1 - 1.0 K/uL   Eosinophils Relative 0  0 - 5 %   Eosinophils Absolute 0.0  0.0 - 0.7 K/uL   Basophils Relative 0  0 - 1 %   Basophils Absolute 0.0  0.0 - 0.1 K/uL  BASIC METABOLIC PANEL     Status: Abnormal   Collection Time    08/17/13  8:07 AM      Result Value Range   Sodium 137  135 - 145 mEq/L   Potassium 4.1  3.5 - 5.1 mEq/L   Chloride 97  96 - 112 mEq/L   CO2 26  19 - 32 mEq/L   Glucose, Bld 119 (*) 70 - 99 mg/dL   BUN 21  6 - 23  mg/dL   Creatinine, Ser 0.86  0.50 - 1.10 mg/dL   Calcium 9.1  8.4 - 57.8 mg/dL   GFR calc non Af Amer >90  >90 mL/min   GFR calc Af Amer >90  >90 mL/min   Comment: (NOTE)     The eGFR has been calculated using the CKD EPI equation.     This calculation has not been validated in all clinical situations.     eGFR's persistently <90 mL/min signify possible Chronic Kidney     Disease.     HEENT: normal and cervical collar Cardio: RRR and no murmurs Resp: CTA B/L and unlabored GI: BS positive and non tender, non distended Extremity:  Pulses positive and No Edema Skin:   Intact and Wound C/D/I and Left ant neck, Steri-Strips peeling off. No evidence of drainage Neuro: Alert/Oriented, Cranial Nerve II-XII normal, Abnormal Sensory reduced sensation to LT left C6,7,8 dermatomes, Abnormal  Motor 3-/5 Bilateral delt bi tri grip, 4- BLE and Abnormal FMC Ataxic/ dec FMC Musc/Skel:  Neck tender posteriorly but not anteriorly Gen NAD   Assessment/Plan: 1. 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 Physiatrist is providing close team supervision and 24 hour management of active medical problems listed below. Physiatrist and rehab team continue to assess barriers to discharge/monitor patient progress toward functional and medical goals. FIM: FIM - Bathing Bathing Steps Patient Completed: Chest;Right Arm;Left Arm;Abdomen;Front perineal area;Buttocks;Right upper leg;Left upper leg;Right lower leg (including foot);Left lower leg (including foot) Bathing: 5: Supervision: Safety issues/verbal cues  FIM - Upper Body Dressing/Undressing Upper body dressing/undressing steps patient completed: Thread/unthread right bra strap;Thread/unthread left bra strap;Thread/unthread right sleeve of pullover shirt/dresss;Thread/unthread left sleeve of pullover shirt/dress;Put  head through opening of pull over shirt/dress Upper body dressing/undressing: 4: Min-Patient completed 75 plus % of tasks FIM - Lower Body Dressing/Undressing Lower body dressing/undressing steps patient completed: Thread/unthread right underwear leg;Thread/unthread left underwear leg;Thread/unthread right pants leg;Thread/unthread left pants leg;Don/Doff right sock;Don/Doff left sock;Don/Doff right shoe;Don/Doff left shoe Lower body dressing/undressing: 3: Mod-Patient completed 50-74% of tasks  FIM - Toileting Toileting steps completed by patient: Adjust clothing prior to toileting;Performs perineal hygiene;Adjust clothing after toileting Toileting: 5: Supervision: Safety issues/verbal cues  FIM - Archivist Transfers Assistive Devices: Art gallery manager Transfers: 5-To toilet/BSC: Supervision (verbal cues/safety issues);4-From toilet/BSC: Min A (steadying Pt. > 75%)  FIM - Bed/Chair Transfer Bed/Chair Transfer Assistive Devices: Therapist, occupational: 5: Supine > Sit: Supervision (verbal cues/safety issues);4: Bed > Chair or W/C: Min A (steadying Pt. > 75%);4: Chair or W/C > Bed: Min A (steadying Pt. > 75%);5: Sit > Supine: Supervision (verbal cues/safety issues)  FIM - Locomotion: Wheelchair Locomotion: Wheelchair: 2: Travels 50 - 149 ft with supervision, cueing or coaxing FIM - Locomotion: Ambulation Locomotion: Ambulation Assistive Devices: Designer, industrial/product Ambulation/Gait Assistance: 4: Min guard Locomotion: Ambulation: 4: Travels 150 ft or more with minimal assistance (Pt.>75%)  Comprehension Comprehension Mode: Auditory Comprehension: 5-Understands complex 90% of the time/Cues < 10% of the time  Expression Expression Mode: Verbal Expression: 5-Expresses complex 90% of the time/cues < 10% of the time  Social Interaction Social Interaction: 6-Interacts appropriately with others with medication or extra time (anti-anxiety, antidepressant).  Problem  Solving Problem Solving: 5-Solves complex 90% of the time/cues < 10% of the time  Memory Memory: 5-Recognizes or recalls 90% of the time/requires cueing < 10% of the time Medical Problem List and Plan:  1. DVT Prophylaxis/Anticoagulation: Pharmaceutical: Lovenox  2. Pain Management: Continue decadron--will add pepcid for GI prophylaxis. Prn oxycodone effective.  3. Mood: On dexamethasone taper, would not start antidepressant at this time. Reassess after dexamethasone has been weaned. 4. Neuropsych: This patient is capable of making decisions on her own behalf.  5. Constipation: Augment bowel program.  6. Leucocytosis: Increasing but afebrile. Likely dexamethasone related will wean  7. No evidence of neurogenic bowel or bladder   LOS (Days) 6 A FACE TO FACE EVALUATION WAS PERFORMED  Shelia Davis E 08/19/2013, 9:43 AM

## 2013-08-19 NOTE — Progress Notes (Signed)
Occupational Therapy Note  Patient Details  Name: Shelia Davis MRN: [id number] Date of Birth: 1951-08-11 Today's Date: 08/19/2013  Time:0900-1015  ( )  1st session Pain:  Left sho. Pain 4/10  Individual session        1st session:  Focus on improved gross/fine motor control of bil UE, introduce use of AE and DME for safety, increase endurance to tolerate activity, improve safety and intellectual awareness of deficits.   Pt. Used RW to ambulate to bathroom and used toilet with BM.  Pt. cleaned self. With no issue.  Ambulated to shower and performed bathing using tub bench.  Pt. Stood in shower to bath peri care.  Provided mod cues to slow down when ambulating with walker.  Provided hand exercies for pt to do on her own.      Time:  1345-1430  (45 min)  2nd session Pain: 3/10 left  Upper traps Individual session  2nd session:  Engaged in holding good posture while keying in information on computer.  Pt. Had difficulty with keying one letter at a time.  Pt  Holding neck in neutral position, but after about 8 minutes of working on computer, pt experienced increased  Upper trapezius pain.  Provided myotherapy to the painful area  And pt felt relief.  Addressed handwriting and simple math.  Pt. Needed to write down figures to calculate correct answer and even then got one answer wrong.   Transferred pt back to bed at end of session and left with all items in place.Humberto Seals 08/19/2013, 9:07 AM

## 2013-08-19 NOTE — Progress Notes (Signed)
Physical Therapy Note  Patient Details  Name: Shelia Davis MRN: [id number] Date of Birth: Jun 25, 1951 Today's Date: 08/19/2013  10:30 - 11:40 70 minutes Individual session Patient reports neck pain at 2.  Patient sitting in recliner upon entering room. Patient ambulated with RW 180 feet from room to gym with occasional steady assist and verbal cueing to slow down for safety. Patient ambulated around and over obstacles and on foam mat with RW and occasional steady assist and verbal cueing to slow down. Patient worked on Editor, commissioning by tossing ball and performing movement tasks and kicking ball. Patient required occational min assist with balance during kicking due to single leg stance. Patient ambulated without assistive device and min steady assist working on starts/stops, varying speeds, directional changes, and ambulating sideways and backwards. Patient ambulated up and down 15 steps with bilateral rails and supervision/cueing for safety/speed. Patient performed [place] exercises and marching in standing x 10 reps with supervision. Patient exercised on Nustep x 15 minutes at workload 4 with exertion 13 following. Patient ambulated 180 feet back to room with RW and close supervision. Patient performed toileting tasks (pants down, pants up, and hygiene) with supervision. Patient stood at sink to wash hands. Patient left in recliner with quick release belt and all items in reach.  Arelia Longest M 08/19/2013, 11:47 AM

## 2013-08-19 NOTE — Progress Notes (Signed)
Physical Therapy Note  Patient Details  Name: Shelia Davis MRN: [id number] Date of Birth: 1950-10-04 Today's Date: 08/19/2013  1:00 - 1:40 40 minutes Individual session Patient denies pain.  Treatment focused on dynamic balance activities using the Wii Just Dance and Biodex balance system for weight shifting, limits of stability, and maze control. Patient ambulated 180 feet x 2 with RW and close supervision with no losses of balance. Patient did need cueing to stay slow and controlled. Patient performed toileting with supervision. Patient left in recliner with quick release belt in place and all items in reach.   Arelia Longest M 08/19/2013, 1:50 PM

## 2013-08-20 ENCOUNTER — Inpatient Hospital Stay (HOSPITAL_COMMUNITY): Payer: 59 | Admitting: Physical Therapy

## 2013-08-20 ENCOUNTER — Inpatient Hospital Stay (HOSPITAL_COMMUNITY): Payer: 59

## 2013-08-20 DIAGNOSIS — M4712 Other spondylosis with myelopathy, cervical region: Secondary | ICD-10-CM

## 2013-08-20 NOTE — Progress Notes (Signed)
Physical Therapy Session Note  Patient Details  Name: Shelia Davis MRN: [id number] Date of Birth: 11/14/1950  Today's Date: 08/20/2013 Time: 0830-0925 Time Calculation (min): 55 min  Short Term Goals: Week 1:  PT Short Term Goal 1 (Week 1): = LTGs  Skilled Therapeutic Interventions/Progress Updates:    First half of session focused on wheelchair level mobility in the home (when by self). Home environment set-up and scenarios (couch, bed, bedside commode, kitchen) utilizing wheelchair and navigating RW while still in wheelchair. Min cues for set up, three instances of decreased safety safety awareness which needed PT intervention to prevent high risk for fall. Pt making good gains on self-correction but still requires min/mod verbal cues.   Rest of session focused on dynamic balance: ambulation with one hand hold assist/no assistive device forwards, side, and back stepping with min assist. Pt continues to demonstrate Lt. Knee hyperextension during stance on Lt, unable to correct with verbal or tactile cues. Pt reports impaired proprioception through Lt. LE. Toe taps without UE support, PT facilitating Lt. Knee control.     Therapy Documentation Precautions:  Precautions Precautions: Cervical Required Braces or Orthoses: Cervical Brace Cervical Brace: Soft collar;For comfort Restrictions Weight Bearing Restrictions: No Pain: No c/o pain  See FIM for current functional status  Therapy/Group: Individual Therapy  Wilhemina Bonito 08/20/2013, 12:15 PM

## 2013-08-20 NOTE — Progress Notes (Signed)
Physical Therapy Note  Patient Details  Name: Shelia Davis MRN: [id number] Date of Birth: 05-20-1951 Today's Date: 08/20/2013  1015-1055 40 min individual therapy Pain rated 2/10 neck; premedicated Cervical precautions, soft collar  In sitting, bil hand use for opening various Rx bottles, min assist intermittently.  Cues to always track hands visually.  Gait with RW x 150' with supervision, x 2.  Cues for slower speed, upright posture.  Repetitive sit>< stand without use of UEs,  focusing on eccentric control.  Pt tends to stand in posterior pelvic tilt.  Dynamic balance in standing on compliant Airex mat and wedge mat. Ankle strategy noted, limited hip strategy, with external perturbations.  Discussed pt's diet and hx of multiple fxs.  She stated she mostly eats salads, and has for many years.  Therapist discussed how diet including protein is beneficial for addressing her Vit D levels, falls and multiple fxs.   She was open to suggestions.   Whisper Kurka 08/20/2013, 10:51 AM

## 2013-08-20 NOTE — Progress Notes (Signed)
Subjective/Complaints: 62 y.o. female with history of DDD with gait disorder and prior cervical fusion C 4-C6. 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 Jeral Fruit. Post op lethargy imporving but patient continues with poor safety awareness, unsteady gait as well as numbness in hands  Up with OT this am. Asks me about return to work probabilities  Review of Systems - Negative except neck pain, warm and cold feeling in hands  Objective: Vital Signs: Blood pressure 140/71, pulse 63, temperature 97.7 F (36.5 C), temperature source Oral, resp. rate 18, weight 94.3 kg (207 lb 14.3 oz), SpO2 96.00%. No results found. No results found for this or any previous visit (from the past 72 hour(s)).   HEENT: normal and cervical collar Cardio: RRR and no murmurs Resp: CTA B/L and unlabored GI: BS positive and non tender, non distended Extremity:  Pulses positive and No Edema Skin:   Intact and Wound C/D/I and Left ant neck, Steri-Strips peeling off. No evidence of drainage Neuro: Alert/Oriented, Cranial Nerve II-XII normal, Abnormal Sensory reduced sensation to LT left C6,7,8 dermatomes, Abnormal Motor 3-/5 Bilateral delt bi tri grip, 4- BLE and Abnormal FMC Ataxic/ dec FMC Musc/Skel:  Neck tender posteriorly but not anteriorly Gen NAD   Assessment/Plan: 1. 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 Physiatrist is providing close team supervision and 24 hour management of active medical problems listed below. Physiatrist and rehab team continue to assess barriers to discharge/monitor patient progress toward functional and medical goals.  Reviewed prognosis and vocational issues with patient today.    FIM: FIM - Bathing Bathing Steps Patient Completed: Chest;Right Arm;Left Arm;Abdomen;Front perineal area;Buttocks;Right upper leg;Left upper leg;Right lower leg (including foot);Left lower leg (including foot) Bathing: 5: Supervision: Safety issues/verbal cues  FIM - Upper Body Dressing/Undressing Upper body dressing/undressing steps patient completed: Thread/unthread right bra strap;Thread/unthread left bra strap;Thread/unthread right sleeve of pullover shirt/dresss;Thread/unthread left sleeve of pullover shirt/dress;Put head through opening of pull over shirt/dress;Pull shirt over trunk Upper body dressing/undressing: 4: Min-Patient completed 75 plus % of tasks FIM - Lower Body Dressing/Undressing Lower body dressing/undressing steps patient completed: Thread/unthread right underwear leg;Thread/unthread left underwear leg;Pull underwear up/down;Thread/unthread right pants leg;Thread/unthread left pants leg;Pull pants up/down;Fasten/unfasten pants;Don/Doff right sock;Don/Doff left sock;Don/Doff right shoe;Don/Doff left shoe Lower body dressing/undressing: 4: Min-Patient completed 75 plus % of tasks  FIM - Toileting Toileting steps completed by patient: Adjust clothing prior to toileting;Performs perineal hygiene;Adjust clothing after toileting Toileting: 7: Independent: No helper, no device  FIM - Diplomatic Services operational officer Devices: Best boy Transfers: 5-To toilet/BSC: Supervision (verbal cues/safety issues);5-From toilet/BSC: Supervision (verbal cues/safety issues)  FIM - Bed/Chair Transfer Bed/Chair Transfer Assistive Devices: Therapist, occupational: 5: Supine > Sit: Supervision (verbal cues/safety issues);5: Bed > Chair or W/C: Supervision (verbal cues/safety issues)  FIM - Locomotion: Wheelchair Locomotion: Wheelchair: 2: Travels 50 - 149 ft with supervision, cueing or coaxing FIM - Locomotion: Ambulation Locomotion: Ambulation Assistive Devices:  Designer, industrial/product Ambulation/Gait Assistance: 5: Supervision Locomotion: Ambulation: 5: Travels 150 ft or more with supervision/safety issues  Comprehension Comprehension Mode: Auditory Comprehension: 5-Understands basic 90% of the time/requires cueing < 10% of the time  Expression Expression Mode: Verbal Expression: 5-Expresses basic 90% of the time/requires cueing < 10% of the time.  Social Interaction Social Interaction: 4-Interacts appropriately 75 - 89% of the time - Needs redirection for appropriate language or to initiate interaction.  Problem Solving Problem Solving: 5-Solves basic 90% of the time/requires cueing < 10% of the time  Memory Memory: 4-Recognizes or recalls 75 - 89% of the time/requires cueing 10 - 24% of the time Medical Problem List and Plan:  1. DVT Prophylaxis/Anticoagulation: Pharmaceutical: Lovenox  2. Pain Management: Continue decadron-- pepcid for GI prophylaxis. Prn oxycodone effective.  3. Mood: On dexamethasone taper, hold on antidepressant at this time. Reassess after dexamethasone has been weaned off. 4. Neuropsych: This patient is capable of making decisions on her own behalf.  5. Constipation: Augment bowel program.  6. Leukocytosis: Increasing but afebrile. Likely dexamethasone related--recheck this week 7. No evidence of neurogenic bowel or bladder   LOS (Days) 7 A FACE TO FACE EVALUATION WAS PERFORMED  Neven Fina T 08/20/2013, 8:56 AM

## 2013-08-20 NOTE — Progress Notes (Signed)
Social Work Patient ID: Shelia Davis, female   DOB: 1950/12/14, 62 y.o.   MRN: [id number]  Met with pt and husband this afternoon to discuss d/c planning needs.  Husband expressing concern about pt's continued functional limitations especially in her hands and questions why this is.  Have left message with Becky at Dr. Cassandria Santee office requesting that he contact husband to discuss.  Dr. Riley Kill also aware of concern.  Husband believes pt's overall cognition has improved and feeling better about d/c end of week.  Plan to follow up with him after team conference tomorrow to confirm d/c date and arrange f/u.  Jalayna Josten, LCSW

## 2013-08-20 NOTE — Progress Notes (Signed)
Patient transferred to 4MW01 via wheelchair, escorted by nursing staff.  All patient belongings taken with patient.  Patient appears to be in no immediate distress at this time.  Report given to [facility], RN. Dani Gobble, RN

## 2013-08-20 NOTE — Progress Notes (Signed)
Occupational Therapy Session Note  Patient Details  Name: Shelia Davis MRN: [id number] Date of Birth: October 12, 1950  Today's Date: 08/20/2013 Time: 0730-0827 Time Calculation (min): 57 min  Short Term Goals: Week 1:  OT Short Term Goal 1 (Week 1): STG=LTG d/t short ELOS  Skilled Therapeutic Interventions: ADL-retraining with emphasis on improved safety awareness and improved independence with self-care tasks.   Patient continues to require supervision for ADL due to impaired short term memory and impaired sustained attention when distracted either internally or externally although she responds well with redirection to stay on task.   Patient educated on need for supervision during bathing/dressing and she reported plan to wait to bathe at night, when her husband is available to supervise, as opposed to mornings.   Patient completed bathing and dressing this date with min assist to don and fasten shoes.   She is unable to snap her bra but reports plan to purchase sports bras until fine motor control returns.   Patient toilets unassisted to include transfers, clothing management and hygiene.    Therapy Documentation Precautions:  Precautions Precautions: Cervical Required Braces or Orthoses: Cervical Brace Cervical Brace: Soft collar;For comfort Restrictions Weight Bearing Restrictions: No  Vital Signs: Therapy Vitals Temp: 97.7 F (36.5 C) Temp src: Oral Pulse Rate: 63 Resp: 18 BP: 140/71 mmHg Patient Position, if appropriate: Lying Oxygen Therapy SpO2: 96 % O2 Device: None (Room air)  Pain: Pain Assessment Pain Assessment: 0-10 Pain Score: 3  Pain Type: Surgical pain Pain Location: Neck Pain Orientation: Lower Pain Frequency: Occasional Pain Onset: With Activity Pain Intervention(s): Medication (See eMAR);RN made aware  See FIM for current functional status  Therapy/Group: Individual Therapy  Second session: Time: 1100-1130 Time Calculation (min): 30 min  Pain  Assessment: No pain  Skilled Therapeutic Interventions: ADL-retraining with emphasis on improved performance with self-care, lower body dressing using AE (shoe posts), and improved independence with use of soft cervical collar.   Patient reported inability to tie shoe laces during morning ADL but was receptive to use of AE, if possible.  OT demo'd use of shoe posts and installed two posts with demonstration on method to don/doff.   Patient required repeated demonstrations and hand guidance to use index finger versus thumb to apply shoe lace to posts.    OT then demonstrated self-application and removal of cervical soft collar by rotating collar opening to front and using mirror to improve attention to distances.   Patient exhibits problem-solving and self-monitoring  deficits limiting fit of collar however she reports improved motivation to practice as demonstrated.  See FIM for current functional status  Therapy/Group: Individual Therapy  Suni Jarnagin 08/20/2013, 8:29 AM

## 2013-08-21 ENCOUNTER — Inpatient Hospital Stay (HOSPITAL_COMMUNITY): Payer: 59 | Admitting: Physical Therapy

## 2013-08-21 ENCOUNTER — Inpatient Hospital Stay (HOSPITAL_COMMUNITY): Payer: 59 | Admitting: *Deleted

## 2013-08-21 ENCOUNTER — Inpatient Hospital Stay (HOSPITAL_COMMUNITY): Payer: 59

## 2013-08-21 DIAGNOSIS — M4712 Other spondylosis with myelopathy, cervical region: Secondary | ICD-10-CM

## 2013-08-21 MED ORDER — DEXAMETHASONE 2 MG PO TABS
2.0000 mg | ORAL_TABLET | Freq: Two times a day (BID) | ORAL | Status: DC
  Administered 2013-08-21 – 2013-08-22 (×4): 2 mg via ORAL
  Filled 2013-08-21 (×7): qty 1

## 2013-08-21 NOTE — Progress Notes (Signed)
I have reviewed and agree with documentation provided of session. - Dezire Turk PT, DPT 

## 2013-08-21 NOTE — Progress Notes (Signed)
Physical Therapy Session Note  Patient Details  Name: Shelia Davis MRN: [id number] Date of Birth: 12-Nov-1950  Today's Date: 08/21/2013 Time: 1191-4782 Time Calculation (min): 42 min  Short Term Goals: Week 1:  PT Short Term Goal 1 (Week 1): = LTGs  Skilled Therapeutic Interventions/Progress Updates:    Patient received supine in bed. Session focused on functional transfers, gait training, and B LE NMR. Switched patient's pants to a pair with more stretch: patient performed sit>stand with supervision and standing balance with min guard to doff pants. Patient then sits to doff pants rest of the way. Patient threaded pants seated edge of bed then performs sit>stand to pull pants up, min guard for balance; patient manages pants without assistance.  Patient performed gait training 100' x1 with RW and close supervision, requires verbal cues for appropriate pacing and management of RW (to maintain with contact on floor). Noted mild L genu recurvatum. Patient performed [place] exercises with B UE support and close supervision to min guard: heel raises x20, mini squats x20, hip abd x10, LAQ with 3" hold x20 each LE; emphasis with all exercises on slow, controlled movements to improve coordination.  Orthotist present at middle of session to don range of motion brace. Patient performed gait training x 100' with RW and close supervision with range of motion knee brace donned on L knee. Noted decreased L genu recurvatum. Patient returned to room and left seated in recliner with seatbelt donned and all needs within reach.  Therapy Documentation Precautions:  Precautions Precautions: Cervical Precaution Comments: cervical handout provided and reviewed with spouse Required Braces or Orthoses: Cervical Brace Cervical Brace: Soft collar;For comfort Restrictions Weight Bearing Restrictions: No Pain: Pain Assessment Pain Assessment: 0-10 Pain Score: 5  Pain Type: Acute pain Pain Location: Shoulder Pain  Orientation: Left Pain Descriptors / Indicators: Aching;Sore Pain Onset: Gradual Pain Intervention(s): RN made aware;Repositioned;Ambulation/increased activity Multiple Pain Sites: No  See FIM for current functional status  Therapy/Group: Individual Therapy  Chipper Herb. Frankie Zito, PT, DPT 08/21/2013, 2:46 PM

## 2013-08-21 NOTE — Progress Notes (Signed)
Occupational Therapy Session Note  Patient Details  Name: Shelia Davis MRN: [id number] Date of Birth: 10/28/1950  Today's Date: 08/21/2013 Time: 0730-0829 Time Calculation (min): 59 min  Short Term Goals: Week 1:  OT Short Term Goal 1 (Week 1): STG=LTG d/t short ELOS  Skilled Therapeutic Interventions: ADL-retraining with emphasis on improved lower body dressing skills, improved fine motor control of bil UE, endurance, safety and anticipatory awareness, and dynamic standing balance.  Patient fully independent with bed mobility this session and did not require cues to remain in bed until bed alarm was disabled (decreased impulsivity noted).   Patient ambulated to bathroom with RW to toilet, voiding urine, and completed clothing management and hygiene unassisted.   Patient completed seated shower with supervision and setup assist due to new shower and chair combination since moving to new room.   Patient required extra time and verbal cues to dress lower body due to inability to problem-solve to don socks.   Patient completed dressing and proceeded to sink using RW for standing grooming (hair and oral care).   Patient reports awareness of her fall risk and is realistic with her inability to perform previous job, post d/c.     Therapy Documentation Precautions:  Precautions Precautions: Cervical Required Braces or Orthoses: Cervical Brace Cervical Brace: Soft collar;For comfort Restrictions Weight Bearing Restrictions: No  Vital Signs: Therapy Vitals Temp: 97.4 F (36.3 C) Temp src: Oral Pulse Rate: 66 Resp: 18 BP: 154/87 mmHg Patient Position, if appropriate: Lying Oxygen Therapy SpO2: 98 % O2 Device: None (Room air)  Pain: Pain Assessment Pain Assessment: 0-10 Pain Score: 7  Pain Type: Acute pain Pain Location: Back (c/o R earache) Pain Orientation: Lower Pain Descriptors / Indicators: Aching;Sore Pain Frequency: Intermittent Pain Onset: On-going Pain Intervention(s):  Medication (See eMAR)  See FIM for current functional status  Therapy/Group: Individual Therapy  Sumiya Mamaril 08/21/2013, 8:33 AM

## 2013-08-21 NOTE — Progress Notes (Signed)
Physical Therapy Session Note  Patient Details  Name: Shelia Davis MRN: [id number] Date of Birth: 1951/09/05  Today's Date: 08/21/2013 Time: 1027-2536 Time Calculation (min): 61 min   Skilled Therapeutic Interventions/Progress Updates:    Pt received seated in w/c. Pt ambulated with RW to ADL apartment with supervision. Pt performed wheelchair mobility in ADL apartment with focusing on set up and safety for transfers to couch and bedside commode. Pt also retrieved pillowcase from drawer and placed on pillow for bilateral fine motor control. Pt required min cues for set up first trial of w/c>bedside commode and to remember to transfer w/c>bed>commode instead of making 360 turn with RW. Second and third trial pt able to self correct w/c and RW position without cueing. Pt continuing to demonstrate improved self-correction and safety awareness but continues to require cues first-second trial of task.   Pt ambulated to therapy gym with RW with supervision. Pt completed obstacle course x3 with RW including cone weaving, stepping over blocks, sidestepping, and navigating obstacles (rolling stool, large step) as well as cognitive/memory tasks to increase difficulty. Pt demonstrated good safety awareness and recall of memory task each trial. Pt then performed standing/reaching and ring toss without UE support min guard-min A. Pt demonstrated ankle and hip strategies when weight shifting and reaching to L but decreased balance strategies and weight shift to R. Pt ambulated to room with RW and left seated in w/c with all needs in reach.   Therapy Documentation Precautions:  Precautions Precautions: Cervical Required Braces or Orthoses: Cervical Brace Cervical Brace: Soft collar;For comfort Restrictions Weight Bearing Restrictions: No Pain: Pain Assessment Pain Assessment: No/denies pain Pain Score: 0-No pain Mobility: Transfers Sit to Stand: 5: Supervision Stand to Sit: 5: Supervision Locomotion  : Ambulation Ambulation/Gait Assistance: 5: Supervision Assistive device: Information systems manager Distance: 15   See FIM for current functional status  Therapy/Group: Individual Therapy  Esme Durkin 08/21/2013, 10:23 AM

## 2013-08-21 NOTE — Plan of Care (Signed)
Problem: RH SAFETY Goal: RH STG ADHERE TO SAFETY PRECAUTIONS W/ASSISTANCE/DEVICE STG Adhere to Safety Precautions With Supervision Outcome: Progressing Calls appropriately for assistance

## 2013-08-21 NOTE — Progress Notes (Signed)
Orthopedic Tech Progress Note Patient Details:  Shelia Davis Oct 16, 1950 [id number]  Patient ID: Shelia Davis, female   DOB: 04-03-1951, 62 y.o.   MRN: [id number] Called in advanced prosthetics brace order; spoke with [facility] @1345   Nikki Dom 08/21/2013, 1:44 PM

## 2013-08-21 NOTE — Plan of Care (Signed)
Problem: RH SKIN INTEGRITY Goal: RH STG SKIN FREE OF INFECTION/BREAKDOWN Pt will have no new skin breakdown/infection while on Rehab with Min assist of caregiver  Outcome: Progressing Surgical incision healing with steristrips intact

## 2013-08-21 NOTE — Progress Notes (Signed)
Subjective/Complaints: 62 y.o. female with history of DDD with gait disorder and prior cervical fusion C 4-C6. 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 Jeral Fruit. Post op lethargy imporving but patient continues with poor safety awareness, unsteady gait as well as numbness in hands  Complained of right ear pain last night. Relieved by tylenol  Review of Systems - Negative except neck pain, warm and cold feeling in hands  Objective: Vital Signs: Blood pressure 154/87, pulse 66, temperature 97.4 F (36.3 C), temperature source Oral, resp. rate 18, weight 94.3 kg (207 lb 14.3 oz), SpO2 98.00%. No results found. No results found for this or any previous visit (from the past 72 hour(s)).   HEENT: normal and cervical collar Cardio: RRR and no murmurs Resp: CTA B/L and unlabored GI: BS positive and non tender, non distended Extremity:  Pulses positive and No Edema Skin:   Intact and Wound C/D/I and Left ant neck, Steri-Strips peeling off. No evidence of drainage Neuro: Alert/Oriented, Cranial Nerve II-XII normal, Abnormal Sensory reduced sensation to LT left C6,7,8 dermatomes as well as lower extremities/trunk.  Abnormal Motor 3/5 Bilateral delt bi tri grip, 4 BLE and Abnormal FMC Ataxic/ dec FMC Musc/Skel: neck appropriately tender Gen NAD   Assessment/Plan: 1. 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 Physiatrist is providing close team supervision and 24 hour management of active medical problems listed below. Physiatrist and rehab team continue to assess barriers to discharge/monitor patient progress toward functional and medical goals.  Reviewed prognosis once again with the patient. Her  husband will also speak with NS. I do think there is room for substantial recovery still, but it will take weeks and likely months.   FIM: FIM - Bathing Bathing Steps Patient Completed: Chest;Right Arm;Left Arm;Abdomen;Front perineal area;Buttocks;Right upper leg;Left upper leg;Right lower leg (including foot);Left lower leg (including foot) Bathing: 5: Supervision: Safety issues/verbal cues  FIM - Upper Body Dressing/Undressing Upper body dressing/undressing steps patient completed: Thread/unthread right bra strap;Thread/unthread left bra strap;Thread/unthread right sleeve of pullover shirt/dresss;Thread/unthread left sleeve of pullover shirt/dress;Put head through opening of pull over shirt/dress;Pull shirt over trunk Upper body dressing/undressing: 4: Min-Patient completed 75 plus % of tasks FIM - Lower Body Dressing/Undressing Lower body dressing/undressing steps patient completed: Thread/unthread right underwear leg;Thread/unthread left underwear leg;Pull underwear up/down;Thread/unthread right pants leg;Thread/unthread left pants leg;Pull pants up/down;Fasten/unfasten pants;Don/Doff right sock;Don/Doff left sock;Don/Doff right shoe;Don/Doff left shoe Lower body dressing/undressing: 4: Min-Patient completed 75 plus % of tasks  FIM - Toileting Toileting steps completed by patient: Adjust clothing prior to toileting;Performs perineal hygiene;Adjust clothing after toileting Toileting: 4: Steadying assist  FIM - Diplomatic Services operational officer Devices: Art gallery manager Transfers: 5-To toilet/BSC: Supervision (verbal cues/safety issues)  FIM - Banker Devices: Therapist, occupational: 5: Supine > Sit: Supervision (verbal cues/safety issues)  FIM - Locomotion: Wheelchair Locomotion: Wheelchair: 2: Travels 50 - 149 ft with supervision, cueing or coaxing FIM - Locomotion: Ambulation Locomotion: Ambulation Assistive Devices: Dealer Ambulation/Gait Assistance: 5: Supervision Locomotion: Ambulation: 5: Travels 150 ft or more with supervision/safety issues  Comprehension Comprehension Mode: Auditory Comprehension: 5-Understands complex 90% of the time/Cues < 10% of the time  Expression Expression Mode: Verbal Expression: 5-Expresses complex 90% of the time/cues <  10% of the time  Social Interaction Social Interaction: 6-Interacts appropriately with others with medication or extra time (anti-anxiety, antidepressant).  Problem Solving Problem Solving: 5-Solves complex 90% of the time/cues < 10% of the time  Memory Memory: 5-Recognizes or recalls 90% of the time/requires cueing < 10% of the time Medical Problem List and Plan:  1. DVT Prophylaxis/Anticoagulation: Pharmaceutical: Lovenox  2. Pain Management: Continue decadron-- pepcid for GI prophylaxis. Prn oxycodone effective.  3. Mood: team to provide egosupport. ?antidepressant.  4. Neuropsych: This patient is capable of making decisions on her own behalf.  5. Constipation: Augmented bowel program.  6. Leukocytosis: Increasing but afebrile. Likely dexamethasone related--recheck this week 7. No evidence of neurogenic bowel or bladder   LOS (Days) 8 A FACE TO FACE EVALUATION WAS PERFORMED  Zvi Duplantis T 08/21/2013, 7:17 AM

## 2013-08-21 NOTE — Progress Notes (Signed)
Patient ID: Shelia Davis, female   DOB: 09-15-1951, 62 y.o.   MRN: [id number] Saw her today. She feels her balance is much better. No pain in her neck and swallowing is better. Still weakness of both hands but she tells me is better now than last week. She is ready to go home. Her weakness can be secondary to her c3-4 stenosis. i will schedule later on for EMG/NCV as outpatient

## 2013-08-21 NOTE — Plan of Care (Signed)
Problem: RH PAIN MANAGEMENT Goal: RH STG PAIN MANAGED AT OR BELOW PT'S PAIN GOAL <3 Outcome: Progressing No c/o pain     

## 2013-08-21 NOTE — Patient Care Conference (Signed)
Inpatient RehabilitationTeam Conference and Plan of Care Update Date: 08/21/2013   Time: 7:21 PM    Patient Name: Shelia Davis      Medical Record Number: [id number]  Date of Birth: 10/09/1950 Sex: Female         Room/Bed: 4M01C/4M01C-01 Payor Info: Payor: Advertising copywriter / Plan: Advertising copywriter / Product Type: *No Product type* /    Admitting Diagnosis: ACDF  Admit Date/Time:  08/13/2013  4:20 PM Admission Comments: No comment available   Primary Diagnosis:  Spondylosis, cervical, with myelopathy Principal Problem: Spondylosis, cervical, with myelopathy  Patient Active Problem List   Diagnosis Date Noted  . Spondylosis, cervical, with myelopathy 08/14/2013  . Post op constipation 08/14/2013    Expected Discharge Date: Expected Discharge Date: 08/23/13  Team Members Present: Physician leading conference: Dr. Faith Rogue Social Worker Present: Amada Jupiter, LCSW Nurse Present: Daryll Brod, RN PT Present: Karolee Stamps, Jerrye Bushy, PT OT Present: Donzetta Kohut, OT;Patricia Janet Berlin, OT PPS Coordinator present : Tora Duck, RN, CRRN;Becky Henrene Dodge, PT     Current Status/Progress Goal Weekly Team Focus  Medical   cervical central cord. gradual recovery, pain controlled  stabilize medically for dc   sci education, orthotic placement   Bowel/Bladder   continent of bowel and bladder; LBM-12/1- getting scheduled sennokot  remain continent of bowel and bladder      Swallow/Nutrition/ Hydration             ADL's   Supervision for ADL and transfers  Mod I  Adapted dressing skills, improved safety and anticipatory awareness, improved fine motor control of bil UE   Mobility   min A overll  S overall  safety awareness, balance, strength, endurance, gait   Communication             Safety/Cognition/ Behavioral Observations            Pain   c/o neck pain 3/10 with movement- controlled with prn meds  < 3      Skin   Left neck incision- steri  strips- healing nicely  Remain free from breakdown or infection       Rehab Goals Patient on target to meet rehab goals: Yes *See Care Plan and progress notes for long and short-term goals.  Barriers to Discharge: safety awareness, left knee instability    Possible Resolutions to Barriers:  education, equipment,     Discharge Planning/Teaching Needs:  home with husband who will provide intermittent and evening assistance      Team Discussion:  Making good progress; NS to f/u with pt and husband about their concerns.  Mobility and safety is primary focus.  Recommend pt remain w/c level mobility when husband not at home.  Overall decrease in needs for safety cues.  New knee brace on right.  Calling for assistance more appropriately.  Revisions to Treatment Plan:  none   Continued Need for Acute Rehabilitation Level of Care: The patient requires daily medical management by a physician with specialized training in physical medicine and rehabilitation for the following conditions: Daily direction of a multidisciplinary physical rehabilitation program to ensure safe treatment while eliciting the highest outcome that is of practical value to the patient.: Yes Daily medical management of patient stability for increased activity during participation in an intensive rehabilitation regime.: Yes Daily analysis of laboratory values and/or radiology reports with any subsequent need for medication adjustment of medical intervention for : Neurological problems;Post surgical problems;Other  Kariem Wolfson 08/21/2013, 7:21 PM

## 2013-08-21 NOTE — Progress Notes (Signed)
Physical Therapy Session Note  Patient Details  Name: Shelia Davis MRN: [id number] Date of Birth: 30-May-1951  Today's Date: 08/21/2013 Time: 1030-1100 Time Calculation (min): 30 min  Short Term Goals: Week 1:  PT Short Term Goal 1 (Week 1): = LTGs  Skilled Therapeutic Interventions/Progress Updates:    Patient received sitting in wheelchair. Session focused on functional transfers and gait training with and without knee brace to further assess L genu recurvatum. Orthotist present analysis. Patient performed gait training 175' with RW and supervision, 2 instances of minA requires secondary to posterior LOB due to patient lifting RW off of ground. Mild L genu recurvatum noted. Patient then performed gait training 125'x1 and 74' x1 with RW and close supervision with L knee brace donned (with extension stop), noted improvement in gait pattern and decreased L genu recurvatum with use of brace. Patient performed gait training back to room, approx 90' with RW and close supervision and left seated in wheelchair with seatbelt donned and all needs within reach.  Therapy Documentation Precautions:  Precautions Precautions: Cervical Precaution Comments: cervical handout provided and reviewed with spouse Required Braces or Orthoses: Cervical Brace Cervical Brace: Soft collar;For comfort Restrictions Weight Bearing Restrictions: No Pain: Pain Assessment Pain Assessment: No/denies pain Pain Score: 0-No pain  See FIM for current functional status  Therapy/Group: Individual Therapy  Chipper Herb. Elchanan Bob, PT, DPT 08/21/2013, 12:17 PM

## 2013-08-21 NOTE — Progress Notes (Signed)
Recreational Therapy Assessment and Plan  Patient Details  Name: Shelia Davis MRN: [id number] Date of Birth: [birth year] Today's Date: 08/21/2013  Rehab Potential: Good ELOS: 2 weeks   Assessment Clinical Impression: Past Medical History:  Past Medical History   Diagnosis  Date   .  Osteoarthritis    .  PONV (postoperative nausea and vomiting)     Past Surgical History:  Past Surgical History   Procedure  Laterality  Date   .  Cholecystectomy     .  Abdominal hysterectomy     .  Neck fusion     .  Falls       numerous breaks from falls,wrist X3, fingers,lt leg,ankle,foot   .  Fracture surgery       freq. falls   .  Anterior cervical decomp/discectomy fusion  N/A  08/07/2013     Procedure: CERVICAL THREE-FOUR ANTERIOR CERVICAL DECOMPRESSION/DISCECTOMY FUSION 1 LEVEL; Surgeon: [name], MD; Location: [facility]; Service: Neurosurgery; Laterality: N/A; C3-4 Anterior cervical decompression/diskectomy/fusion    Assessment & Plan  Clinical Impression: Patient is a 62 y.o. year old female with recent admission to the hospital with history of DDD with gait disorder and prior cervical fusion C 4-C6. 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 Jeral Fruit. Post op lethargy imporving but patient continues with poor safety awareness, unsteady gait as well as numbness in hands. She had syncopal episode past surgery and BP stabilizing. On steroid taper currently. MD, PT recommending CIR. Patient complaining of hand pain and numbness and tingling bilateral as well as weakness. Patient transferred to CIR on 08/13/2013.   Pt presents with decreased activity tolerance, decreased functional mobility, decreased balance, decreased coordination & functional use of BUE's, decreased safety,Limiting pt's independence with leisure/community pursuits.  Leisure  History/Participation Premorbid leisure interest/current participation: Garment/textile technologist - Journalist, newspaper - Press photographer - Travel (Comment) Other Leisure Interests: Television;Movies;Cooking/Baking Leisure Participation Style: With Family/Friends Awareness of Community Resources: Good-identify 3 post discharge leisure resources Psychosocial / Spiritual Social interaction - Mood/Behavior: Cooperative Firefighter Appropriate for Education?: Yes Recreational Therapy Orientation Orientation -Reviewed with patient: Available activity resources Strengths/Weaknesses Patient Strengths/Abilities: Willingness to participate;Active premorbidly Patient weaknesses: Physical limitations TR Patient demonstrates impairments in the following area(s): Endurance;Sensory  Plan Rec Therapy Plan Is patient appropriate for Therapeutic Recreation?: Yes Rehab Potential: Good Treatment times per week: MIn 2 times per week >20 min Estimated Length of Stay: 2 weeks TR Treatment/Interventions: Adaptive equipment instruction;1:1 session;Balance/vestibular training;Functional mobility training;Community reintegration;Patient/family education;Therapeutic activities;Recreation/leisure participation;Therapeutic exercise;UE/LE Coordination activities Recommendations for other services: Neuropsych Discharge Criteria: Patient will be discharged from TR if patient refuses treatment 3 consecutive times without medical reason.  If treatment goals not met, if there is a change in medical status, if patient makes no progress towards goals or if patient is discharged from hospital.  The above assessment, treatment plan, treatment alternatives and goals were discussed and mutually agreed upon: by patient  Winston Misner 08/21/2013, 2:49 PM

## 2013-08-22 ENCOUNTER — Inpatient Hospital Stay (HOSPITAL_COMMUNITY): Payer: 59 | Admitting: Occupational Therapy

## 2013-08-22 ENCOUNTER — Inpatient Hospital Stay (HOSPITAL_COMMUNITY): Payer: 59 | Admitting: *Deleted

## 2013-08-22 ENCOUNTER — Inpatient Hospital Stay (HOSPITAL_COMMUNITY): Payer: 59 | Admitting: Physical Therapy

## 2013-08-22 ENCOUNTER — Inpatient Hospital Stay (HOSPITAL_COMMUNITY): Payer: 59

## 2013-08-22 NOTE — Consult Note (Signed)
NEUROCOGNITIVE TESTING - [facility]   [name] is a 62 year old woman, who was seen for a brief neurocognitive status examination to assess her cognitive and emotional functioning post-spinal compression.  According to her medical record, she was admitted on 08/07/13 for spinal decompression and ACDF C3/4.  Post-surgery, she has had poor safety awareness, unsteady gait and bimanual numbness.    PROCEDURES: [3 units of 78295 on 08/15/2013]  The following tests were performed during today's visit: Mini Mental Status Examination-2 (brief version), Repeatable Battery for the Assessment of Neuropsychological Status (RBANS, form A), Geriatric Anxiety Inventory and the Geriatric Depression Scale (short form).  Test results are as follows:   MMSE-2 (brief) Raw Score = 10/16 Description = Impaired    RBANS Indices Scaled Score Percentile Description  Immediate Memory  73 4 Impaired  Visuospatial/Constructional 66 1 Profoundly Impaired  Language 57 < 1 Profoundly Impaired  Attention 72 3 Impaired  Delayed Memory 60 < 1 Profoundly Impaired  Total Score 56 < 1 Profoundly Impaired   RBANS Subtests Raw Score Percentile Description  List Learning 23 13 Below Average  Story Memory 10 1 Profoundly Impaired  Figure Copy 16 10 Below Average  Line Orientation 7 < 1 Profoundly Impaired  Picture Naming 7 < 1 Profoundly Impaired  Semantic Fluency 9 < 1 Profoundly Impaired  Digit Span 9 27 Average  Coding 22 < 1 Profoundly Impaired  List Recall 1 1 Profoundly Impaired  List Recognition 17 2 Profoundly Impaired  Story Recall 3 < 1 Profoundly Impaired  Figure recall 4 1 Profoundly Impaired   GDS (short) Raw Score = 7/15 Description = Mild   GAI Raw Score = 3/20 Description = WNL   [name]'s score on an embedded measure of performance validity was within normal limits and there were no behavioral manifestations to suggest suboptimal effort.  As such, the  following test results likely represent an accurate portrayal of her current level of cognitive functioning.    [name] performances across cognitive domains were largely impaired.  She remained intact only on a measure of simple attention, though she also had below average performances on measures of immediate memory for rote information and complex visual organization.  All of her other performances were profoundly impaired.  Although she had trouble gripping the pencil, she was able to complete tasks requiring writing or drawing and numbness in her hands seemed to only minimally adversely impact those performances.  Behaviorally, she was impulsive in her test-taking style and also interrupted test instructions to make irrelevant comments (e.g. "this is a funny pencil").  Subjectively, [name] commented that she has been having "trouble thinking" and cannot remember information that she has been told.  From an emotional standpoint, she endorsed symptoms consistent with mild depression, but there was no evidence of clinically significant anxiety at this time.    [name] overall neurocognitive profile included multiple impairments at the level of dementia.  Most notable impairments were seen in executive functioning and aspects of learning and memory, though most skills were impaired.  Although there was evidence of mild depressed mood, which could be exacerbating her cognitive disruption, it is not thought that mood changes can fully account for the deficits seen on testing.  Still, depressed mood should be addressed through continued support while on the unit.  She should be followed regularly by the social worker and scheduled with the neuropsychologist for this purpose.    In light of these findings, the  following recommendations are provided.    RECOMMENDATIONS:    Engage in supportive psychotherapy with the neuropsychologist prior to discharge.   [name] mood seemed to improve when she  successfully wrote her name on a paper.  She expressed interest in continuing to work toward improving her fine motor skills.  If possible, she should be provided with a pencil and scratch paper for this purpose.     When interacting with [name], directions and information should be provided in a simple, straight forward manner, and the treatment team should avoid giving multiple instructions simultaneously.    [name] may also benefit from being provided with multiple trials to learn new skills given the noted encoding inefficiencies.    [name] memory will also likely be aided with provision of written reminders.  Those around her can help her by writing down important information in order to help her keep track of it.     To the extent possible, multitasking should be avoided.   Performance will generally be best in a structured, routine, and familiar environment, as opposed to situations involving complex problems.   Leavy Cella, Psy.D.  Clinical Neuropsychologist

## 2013-08-22 NOTE — Progress Notes (Signed)
Patient ID: Shelia Davis, female   DOB: 1950/11/15, 62 y.o.   MRN: [id number] Balance better. Hands getting stronger. Home in am?

## 2013-08-22 NOTE — Progress Notes (Signed)
I have reviewed the progress note and in agreement with content.

## 2013-08-22 NOTE — Progress Notes (Signed)
Recreational Therapy Session Note  Patient Details  Name: Shelia Davis MRN: [id number] Date of Birth: 28-Mar-1951 Today's Date: 08/22/2013  Pain: no c/o Skilled Therapeutic Interventions/Progress Updates: Session focused on activity tolerance, ambulation with RW, fine motor use & coordination of BUE's.  Pt ambulated from room to [facility] room with RW with close supervision.  Pt sat at computer for typing activity with mod I.  Pt frustrated with her accuracy in typing but determined to improve in the hopes to return to work in the future.  Therapy/Group: Individual Therapy  Dyllan Kats 08/22/2013, 2:28 PM

## 2013-08-22 NOTE — Progress Notes (Signed)
Physical Therapy Session Note  Patient Details  Name: Shelia Davis MRN: [id number] Date of Birth: September 17, 1951  Today's Date: 08/22/2013 Time: 1000-1030 Time Calculation (min): 30 min  Short Term Goals: STG=LTG  Skilled Therapeutic Interventions/Progress Updates:    Pt received seated in recliner. Pt performed sit>stand with RW mod I and ambulated 150 to therapy gym supervision. Completed Berg Balance Test with score of 41/56, details below. Pt had most difficulty with single leg stance and tandem stance. Pt performed bed mobility (supine<>sit, rolling R and L) on flat mat, no rails mod I. Assessed strength RLE WFL; LLE 4-/5 overall. RUE 4/5 overall; LUE 3+/5 overall. Sensation appears intact by gross assessment. Finger to nose mild decrease in coordination RUE, moderate decrease in coordination LUE. Heel to shin WFL bilaterally. Pt ambulated to room with RW supervision, reported need to toilet. Pt ambulated into bathroom supervision and demonstrated good awareness of uneven surfaces and safety with RW. Pt completed pants up/down and hygiene independently. Pt left seated in recliner with rec therapist present for next therapy session.  Therapy Documentation Precautions:  Precautions Precautions: Cervical Precaution Comments: cervical handout provided and reviewed with spouse Required Braces or Orthoses: Cervical Brace Cervical Brace: Soft collar;For comfort Restrictions Weight Bearing Restrictions: No   Pain: Pain Assessment Pain Assessment: No/denies pain Pain Score: 0-No pain Mobility: Bed Mobility Bed Mobility: Rolling Right;Rolling Left;Supine to Sit;Sit to Supine Rolling Right: 6: Modified independent (Device/Increase time) Rolling Left: 6: Modified independent (Device/Increase time) Supine to Sit: 6: Modified independent (Device/Increase time) Sit to Supine: 6: Modified independent (Device/Increase time) Transfers Transfers: Yes (Stand-step w/c<>bed mod I) Sit to Stand: 6:  Modified independent (Device/Increase time) Stand to Sit: 6: Modified independent (Device/Increase time) Locomotion : Ambulation Ambulation: Yes Ambulation/Gait Assistance: 5: Supervision Ambulation Distance (Feet): 150 Feet Assistive device: Rolling walker Ambulation/Gait Assistance Details: Verbal cues for precautions/safety  Balance: Balance Balance Assessed: Yes Standardized Balance Assessment Standardized Balance Assessment: Berg Balance Test Berg Balance Test Sit to Stand: Able to stand without using hands and stabilize independently Standing Unsupported: Able to stand 2 minutes with supervision Sitting with Back Unsupported but Feet Supported on Floor or Stool: Able to sit safely and securely 2 minutes Stand to Sit: Sits safely with minimal use of hands Transfers: Able to transfer safely, definite need of hands Standing Unsupported with Eyes Closed: Able to stand 10 seconds with supervision Standing Ubsupported with Feet Together: Able to place feet together independently and stand for 1 minute with supervision From Standing, Reach Forward with Outstretched Arm: Can reach forward >12 cm safely (5") From Standing Position, Pick up Object from Floor: Able to pick up shoe, needs supervision From Standing Position, Turn to Look Behind Over each Shoulder: Looks behind one side only/other side shows less weight shift Turn 360 Degrees: Able to turn 360 degrees safely in 4 seconds or less Standing Unsupported, Alternately Place Feet on Step/Stool: Able to stand independently and safely and complete 8 steps in 20 seconds Standing Unsupported, One Foot in Front: Loses balance while stepping or standing Standing on One Leg: Unable to try or needs assist to prevent fall Total Score: 41 Static Sitting Balance Static Sitting - Balance Support: Feet supported Static Sitting - Level of Assistance: 6: Modified independent (Device/Increase time)  See FIM for current functional  status  Therapy/Group: Individual Therapy  Cicilia Clinger 08/22/2013, 12:11 PM

## 2013-08-22 NOTE — Progress Notes (Signed)
Orthopedic Tech Progress Note Patient Details:  Shelia Davis May 21, 1951 [id number]  Patient ID: Shelia Davis, female   DOB: 08-31-1951, 62 y.o.   MRN: [id number]   Shelia Davis 08/22/2013, 9:49 AMCalled advanced for knee hyperextension brace.

## 2013-08-22 NOTE — Progress Notes (Signed)
I have reviewed the progress note and am in agreement.

## 2013-08-22 NOTE — Progress Notes (Signed)
Occupational Therapy Session Note  Patient Details  Name: Shelia Davis MRN: [id number] Date of Birth: [birth year]  Today's Date: 08/22/2013 Time: 0730-0830 Time Calculation (min): 60 min  Short Term Goals: Week 1:  OT Short Term Goal 1 (Week 1): STG=LTG d/t short ELOS  Skilled Therapeutic Interventions: ADL-retraining with emphasis on discharge planning, dynamic standing balance, safety awareness, improved fine motor control of bil UE, adapted dressing skills.    Patient able to ambulate within room to retrieve clothing and complete transfers to/from toilet and shower chair with supervision assist for safety due to impulsivity and impaired problem-solving with use of RW.   Patient completes bathing, sitting and standing using shower chair, hand shower, and grab bars as needed without physical assistance.   Patient completes dressing at edge of bed but has been unable to master donning bra (standard or sports), although she reports plan to purchase new sports bra made with heavier material to reduce rolling.   Patient able to don socks and shoes independently modified by use of shoe posts and extra time to manipulate socks.    Patient reports plan to have her husband standby while she is bathing for safety and she does not plan to cook due to impaired hand function.   Patient demonstrates evidence of improved anticipatory awareness but continues to require reminders to use built-up silverware when self-feeding.   Patient is able to self-feed with use of built-up silverware and preselected choice of meals.     Therapy Documentation Precautions:  Precautions Precautions: Cervical Precaution Comments: cervical handout provided and reviewed with spouse Required Braces or Orthoses: Cervical Brace Cervical Brace: Soft collar;For comfort Restrictions Weight Bearing Restrictions: No  Vital Signs: Therapy Vitals Temp: 98.1 F (36.7 C) Temp src: Oral Pulse Rate: 64 Resp: 18 BP: 127/76 mmHg Patient  Position, if appropriate: Lying Oxygen Therapy SpO2: 98 % O2 Device: None (Room air)  Pain: Pain Assessment Pain Assessment: 0-10 Pain Score: 7  Pain Type: Acute pain Pain Location: Back Pain Orientation: Lower Pain Intervention(s): Repositioned;Distraction;RN made aware  See FIM for current functional status  Therapy/Group: Individual Therapy  Semaje Kinker 08/22/2013, 8:32 AM

## 2013-08-22 NOTE — Progress Notes (Signed)
Physical Therapy Session Note  Patient Details  Name: Shelia Davis MRN: [id number] Date of Birth: 11-21-1950  Today's Date: 08/22/2013 Time: 1400-1455 Time Calculation (min): 55 min  Short Term Goals: STG=LTG  Skilled Therapeutic Interventions/Progress Updates:    First half of session focus on family education with husband. Pt received seated in recliner with husband present. Pt ambulated with RW supervision to ortho gym. Pt negotiated 4 steps with 2 rails supervision, first with therapist demonstrating appropriate supervision technique and second trial husband providing supervision. Pt and husband verbalized and demonstrated understanding of safe stair negotiation. Pt performed car transfer with RW supervision with husband. Demonstrated how to fold RW for storage in car and husband demonstrated understanding. Discussed with pt and husband safety considerations if pt were to fall at home and pt verbalized understanding. Encouraged pt to keep her cell phone with her at all times when she is home alone, pt agreed. Demonstrated floor transfer and pt performed floor transfer with supervision, requiring 2 trials and mod-max verbal cues for technique. Pt initially attempted to pull herself up from side sitting with UE, but was unsuccessful. Pt able to complete floor transfer with verbal cues to get on hands and knees to push up from floor.  Pt c/o knee pain when in quadruped but pain subsided once pt achieved sitting. After performing transfer, pt able to correctly verbalize floor transfer technique x2. Pt ambulated ~50 feet with RW supervision throughout ADL apartment navigating around furniture and over carpet and tile.   Second half of session focus on dynamic standing balance. Pt performed anterior/posterior and lateral weight shifting on Biodex, first with UE support and then without UE support. Pt required manual facilitation and mod verbal cues for weight shifting, with most difficulty noted when  attempting to weight shift to L. Pt initially attempted to weight shift by leaning/turning trunk instead of utilizing ankle/hip strategies. Pt then performed standing reaching/ring toss activity without UE support min guard. Pt demonstrated improved weight shift and balance reactions during this activity vs Biodex. Pt ambulated to room with RW supervision and left seated in recliner with husband present.   Therapy Documentation Precautions:  Precautions Precautions: Cervical Precaution Comments: cervical handout provided and reviewed with spouse Required Braces or Orthoses: Cervical Brace Cervical Brace: Soft collar;For comfort Restrictions Weight Bearing Restrictions: No Pain: Pain Assessment Pain Assessment: No/denies pain Pain Score: 0-No pain Mobility: Transfers Sit to Stand: 6: Modified independent (Device/Increase time) Stand to Sit: 6: Modified independent (Device/Increase time) Locomotion : Ambulation Ambulation: Yes Ambulation/Gait Assistance: 5: Supervision Ambulation Distance (Feet): 150 Feet Assistive device: Rolling walker Stairs / Additional Locomotion Stairs: Yes Stairs Assistance: 5: Supervision Stair Management Technique: Two rails;Alternating pattern;Forwards Number of Stairs: 4 Height of Stairs: 6   Balance: Dynamic Standing Balance Dynamic Standing - Balance Support: No upper extremity supported;During functional activity Dynamic Standing - Level of Assistance: 5: Stand by assistance Dynamic Standing - Comments: Reaching, weight shifting  See FIM for current functional status  Therapy/Group: Individual Therapy  Liyana Suniga 08/22/2013, 3:49 PM

## 2013-08-22 NOTE — Progress Notes (Signed)
Occupational Therapy Session Note  Patient Details  Name: Shelia Davis MRN: [id number] Date of Birth: 07/26/1951  Today's Date: 08/22/2013 Time: 6962-9528 Time Calculation (min): 45 min  Short Term Goals: Week 1:  OT Short Term Goal 1 (Week 1): STG=LTG d/t short ELOS  Skilled Therapeutic Interventions/Progress Updates:  Patient found supine in bed upon entering room. Patient engaged in bed mobility and stood with RW. Patient then ambulated > dayroom. Therapeutic activity focusing on functional ambulation around unit using RW, dynamic standing balance/tolerance/endurance, and overall activity tolerance/endurance. Patient with some LOB during activity and required steady assist for long distance ambulation. Patient's husband present at end of session, discussed importance of supervision at home with bathing & dressing tasks. Patient left seated in recliner with quick release belt donned and all needed items within reach.   Precautions:  Precautions Precautions: Cervical Precaution Comments: cervical handout provided and reviewed with spouse Required Braces or Orthoses: Cervical Brace Cervical Brace: Soft collar;For comfort Restrictions Weight Bearing Restrictions: No  See FIM for current functional status  Therapy/Group: Individual Therapy  Breydon Senters 08/22/2013, 2:18 PM

## 2013-08-22 NOTE — Progress Notes (Signed)
Social Work Patient ID: Shelia Davis, female   DOB: 02/22/1951, 62 y.o.   MRN: [id number]   Have reviewed team conference info with pt and husband. Both aware and agreeable with change in d/c date to 12/4 with husband to be here today @ 2pm to complete family education.  Will follow up with them both again today to discuss dme and follow up recommendations.  Rodriguez Aguinaldo, LCSW

## 2013-08-22 NOTE — Progress Notes (Signed)
Subjective/Complaints: 62 y.o. female with history of DDD with gait disorder and prior cervical fusion C 4-C6. 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 Jeral Fruit. Post op lethargy imporving but patient continues with poor safety awareness, unsteady gait as well as numbness in hands  Up in shower. No new complaints. Had a good night  Review of Systems -  Objective: Vital Signs: Blood pressure 127/76, pulse 64, temperature 98.1 F (36.7 C), temperature source Oral, resp. rate 18, weight 93.078 kg (205 lb 3.2 oz), SpO2 98.00%. No results found. No results found for this or any previous visit (from the past 72 hour(s)).   HEENT: normal and cervical collar Cardio: RRR and no murmurs Resp: CTA B/L and unlabored GI: BS positive and non tender, non distended Extremity:  Pulses positive and No Edema Skin:   Intact and Wound C/D/I and Left ant neck, Steri-Strips peeling off. No evidence of drainage Neuro: Alert/Oriented, Cranial Nerve II-XII normal, Abnormal Sensory reduced sensation to LT left C6,7,8 dermatomes as well as lower extremities/trunk.  Abnormal Motor 3/5 Bilateral delt bi tri grip, 4 BLE and Abnormal FMC Ataxic/ dec FMC Musc/Skel: neck appropriately tender Gen NAD   Assessment/Plan: 1. 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 Physiatrist is providing close team supervision and 24 hour management of active medical problems listed below. Physiatrist and rehab team continue to assess barriers to discharge/monitor patient progress toward functional and medical goals.   Marland Kitchen   FIM: FIM - Bathing Bathing Steps Patient Completed: Chest;Right Arm;Left Arm;Abdomen;Front perineal  area;Buttocks;Right upper leg;Left upper leg;Right lower leg (including foot);Left lower leg (including foot) Bathing: 5: Supervision: Safety issues/verbal cues  FIM - Upper Body Dressing/Undressing Upper body dressing/undressing steps patient completed: Thread/unthread right bra strap;Thread/unthread left bra strap;Thread/unthread right sleeve of pullover shirt/dresss;Thread/unthread left sleeve of pullover shirt/dress;Put head through opening of pull over shirt/dress;Pull shirt over trunk Upper body dressing/undressing: 4: Min-Patient completed 75 plus % of tasks FIM - Lower Body Dressing/Undressing Lower body dressing/undressing steps patient completed: Thread/unthread right underwear leg;Thread/unthread left underwear leg;Pull underwear up/down;Thread/unthread right pants leg;Thread/unthread left pants leg;Pull pants up/down;Fasten/unfasten pants;Don/Doff right sock;Don/Doff left sock;Don/Doff right shoe;Don/Doff left shoe;Fasten/unfasten right shoe;Fasten/unfasten left shoe Lower body dressing/undressing: 6: More than reasonable amount of time  FIM - Toileting Toileting steps completed by patient: Adjust clothing prior to toileting;Performs perineal hygiene;Adjust clothing after toileting Toileting: 4: Steadying assist  FIM - Diplomatic Services operational officer Devices: Art gallery manager Transfers: 5-To toilet/BSC: Supervision (verbal cues/safety issues)  FIM - Banker Devices: Therapist, occupational: 5: Supine > Sit: Supervision (verbal cues/safety issues)  FIM - Locomotion: Wheelchair Distance: 15 Locomotion: Wheelchair: 0: Activity did not occur FIM - Locomotion: Ambulation Locomotion: Ambulation Assistive Devices: Designer, industrial/product Ambulation/Gait Assistance: 5: Supervision Locomotion: Ambulation: 2: Travels 50 - 149 ft with supervision/safety issues  Comprehension Comprehension Mode: Auditory Comprehension: 5-Understands complex  90% of the time/Cues < 10% of the time  Expression Expression Mode: Verbal Expression: 5-Expresses complex 90% of the time/cues < 10% of the time  Social Interaction Social Interaction: 6-Interacts appropriately with others with medication or extra time (anti-anxiety, antidepressant).  Problem Solving Problem Solving: 5-Solves complex 90% of the time/cues < 10% of the time  Memory Memory: 5-Recognizes or  recalls 90% of the time/requires cueing < 10% of the time Medical Problem List and Plan:  1. DVT Prophylaxis/Anticoagulation: Pharmaceutical: Lovenox  2. Pain Management: Continue decadron-- pepcid for GI prophylaxis. Prn oxycodone effective.  3. Mood: team to provide egosupport. ?antidepressant.  4. Neuropsych: This patient is capable of making decisions on her own behalf.  5. Constipation: Augmented bowel program.  6. Leukocytosis: Increasing but afebrile. Likely dexamethasone related--recheck tomorrow 7. No evidence of neurogenic bowel or bladder   LOS (Days) 9 A FACE TO FACE EVALUATION WAS PERFORMED  SWARTZ,ZACHARY T 08/22/2013, 7:52 AM

## 2013-08-23 LAB — CBC
HCT: 41.7 % (ref 36.0–46.0)
MCHC: 32.4 g/dL (ref 30.0–36.0)
Platelets: 274 10*3/uL (ref 150–400)
RBC: 4.87 MIL/uL (ref 3.87–5.11)
RDW: 15.9 % — ABNORMAL HIGH (ref 11.5–15.5)

## 2013-08-23 MED ORDER — DEXAMETHASONE 2 MG PO TABS
2.0000 mg | ORAL_TABLET | Freq: Every day | ORAL | Status: DC
  Administered 2013-08-23: 2 mg via ORAL
  Filled 2013-08-23 (×2): qty 1

## 2013-08-23 MED ORDER — DEXAMETHASONE 2 MG PO TABS: 2.0000 mg | ORAL_TABLET | Freq: Every day | ORAL | Status: DC

## 2013-08-23 MED ORDER — TRAMADOL HCL 50 MG PO TABS: 50.0000 mg | ORAL_TABLET | Freq: Four times a day (QID) | ORAL | Status: DC | PRN

## 2013-08-23 MED ORDER — METHOCARBAMOL 500 MG PO TABS: 500.0000 mg | ORAL_TABLET | Freq: Four times a day (QID) | ORAL | Status: DC | PRN

## 2013-08-23 MED ORDER — ACETAMINOPHEN 325 MG PO TABS: 650.0000 mg | ORAL_TABLET | Freq: Four times a day (QID) | ORAL | Status: AC | PRN

## 2013-08-23 NOTE — Progress Notes (Signed)
Occupational Therapy Discharge Summary  Patient Details  Name: Shelia Davis MRN: [id number] Date of Birth: 1951-06-07  Today's Date: 08/23/2013  Patient has met 7 of 8 long term goals due to improved activity tolerance, improved balance, ability to compensate for deficits, functional use of  RIGHT upper and LEFT upper extremity, improved awareness and improved coordination.  Patient to discharge at overall modified independent level for ADL and supervision level for functional transfers.  Patient's care partner is independent to provide the necessary cognitive assistance at discharge to insure safe transfers, to assist with problem-solving during complex functional tasks, and to provide cues to reduce incidental impulses to ambulate independently.    Reasons goals not met: Patient unable to don bra independently due to impaired fine motor control  Recommendation:  Patient will benefit from ongoing skilled OT services in home health setting to continue to advance functional skills in the area of BADL and fine motor control of bil hands.  Equipment: Paediatric nurse, bedside commode  Reasons for discharge: treatment goals met  Patient/family agrees with progress made and goals achieved: Yes  OT Discharge Precautions/Restrictions  Precautions Precautions: Cervical Precaution Comments: cervical handout provided and reviewed with spouse Cervical Brace: Soft collar;For comfort Restrictions Weight Bearing Restrictions: No  Pain Pain Assessment Pain Assessment: No/denies pain  ADL ADL Equipment Provided: Other (comment) (shoe lace posts, foam tubing for built-up grips) Eating: Modified independent Where Assessed-Eating: Edge of bed Grooming: Modified independent Where Assessed-Grooming: Standing at sink Upper Body Bathing: Modified independent Where Assessed-Upper Body Bathing: Shower Lower Body Bathing: Modified independent Where Assessed-Lower Body Bathing: Shower Upper Body  Dressing: Modified independent (Device) Where Assessed-Upper Body Dressing: Edge of bed Lower Body Dressing: Modified independent Where Assessed-Lower Body Dressing: Edge of bed Toileting: Modified independent Where Assessed-Toileting: Teacher, adult education: Close supervision Toilet Transfer Method: Surveyor, minerals: Grab bars;Bedside commode Film/video editor: Close supervision Film/video editor Method: Warden/ranger: Shower seat with back  Vision/Perception  Vision - History Baseline Vision: Wears glasses all the time Patient Visual Report: No change from baseline Vision - Assessment Eye Alignment: Within Functional Limits Perception Perception: Within Functional Limits Praxis Praxis: Intact   Cognition Overall Cognitive Status: Impaired/Different from baseline Arousal/Alertness: Awake/alert Orientation Level: Oriented X4 Attention: Alternating Focused Attention: Appears intact Sustained Attention: Appears intact Alternating Attention: Appears intact Memory: Impaired Memory Impairment: Decreased short term memory Decreased Short Term Memory: Functional complex Awareness: Appears intact Awareness Impairment: Anticipatory impairment Problem Solving: Impaired Problem Solving Impairment: Functional complex Executive Function: Self Correcting Organizing: Appears intact Organizing Impairment: Functional complex Self Monitoring: Appears intact Self Correcting: Impaired Self Correcting Impairment: Functional complex Behaviors: Impulsive Safety/Judgment: Impaired Comments: Improved safety awareness but continues to demonstrate impulsive movements and difficulty problem solving in complex environments  Sensation Sensation Light Touch: Appears Intact Stereognosis: Impaired Detail Stereognosis Impaired Details: Impaired RUE;Impaired LUE Hot/Cold: Appears Intact Proprioception: Appears Intact Additional Comments:  impaired fine motor control: prehension and manipulation of small items with bil hands Coordination Gross Motor Movements are Fluid and Coordinated: Yes Fine Motor Movements are Fluid and Coordinated: No Finger Nose Finger Test: Mild decrease in coordination RUE, moderate decrease in coordination LUE  Motor  Motor Motor: Within Functional Limits Motor - Discharge Observations: Pt demonstrates L sided weakness compared to R (UE>LE), lack of coordination BUE, poor fine motor control, decreased balance reactions, genu recurvatum during stance on L  Mobility  Transfers Transfers: Sit to Stand;Stand to Sit Sit to Stand: 6: Modified independent (  Device/Increase time) Stand to Sit: 6: Modified independent (Device/Increase time)   Trunk/Postural Assessment  Cervical Assessment Cervical Assessment: Exceptions to [facility] Thoracic Assessment Thoracic Assessment: Within Functional Limits Lumbar Assessment Lumbar Assessment: Within Functional Limits Postural Control Postural Control: Within Functional Limits   Balance Balance Balance Assessed: Yes Standardized Balance Assessment Standardized Balance Assessment: Berg Balance Test Berg Balance Test Sit to Stand: Able to stand without using hands and stabilize independently Standing Unsupported: Able to stand 2 minutes with supervision Sitting with Back Unsupported but Feet Supported on Floor or Stool: Able to sit safely and securely 2 minutes Stand to Sit: Sits safely with minimal use of hands Transfers: Able to transfer safely, definite need of hands Standing Unsupported with Eyes Closed: Able to stand 10 seconds with supervision Standing Ubsupported with Feet Together: Able to place feet together independently and stand for 1 minute with supervision From Standing, Reach Forward with Outstretched Arm: Can reach forward >12 cm safely (5") From Standing Position, Pick up Object from Floor: Able to pick up shoe, needs supervision From Standing  Position, Turn to Look Behind Over each Shoulder: Looks behind one side only/other side shows less weight shift Turn 360 Degrees: Able to turn 360 degrees safely in 4 seconds or less Standing Unsupported, Alternately Place Feet on Step/Stool: Able to stand independently and safely and complete 8 steps in 20 seconds Standing Unsupported, One Foot in Front: Loses balance while stepping or standing Standing on One Leg: Unable to try or needs assist to prevent fall Total Score: 41 Static Sitting Balance Static Sitting - Balance Support: Feet supported Static Sitting - Level of Assistance: 6: Modified independent (Device/Increase time) Static Standing Balance Static Standing - Balance Support: No upper extremity supported;Bilateral upper extremity supported Static Standing - Level of Assistance: 5: Stand by assistance Dynamic Standing Balance Dynamic Standing - Balance Support: No upper extremity supported;During functional activity Dynamic Standing - Level of Assistance: 5: Stand by assistance Dynamic Standing - Comments: reaching, weight-shifting  Extremity/Trunk Assessment RUE Assessment RUE Assessment: Exceptions to [facility] RUE AROM (degrees) Right Shoulder Flexion: 150 Degrees LUE Assessment LUE Assessment: Exceptions to [facility] LUE AROM (degrees) Left Shoulder Flexion: 150 Degrees  See FIM for current functional status  Jaeanna Mccomber 08/23/2013, 3:16 PM

## 2013-08-23 NOTE — Progress Notes (Signed)
Recreational Therapy Discharge Summary Patient Details  Name: BROOKIE WAYMENT MRN: [id number] Date of Birth: [birth year] Today's Date: 08/23/2013  Long term goals set: 1  Long term goals met: 1  Comments on progress toward goals: Pt has made good progress toward goal and met Mod I level for simple TR tasks seated.  Pt requires extra time to complete tasks.  Pt does require supervision for standing activites. Pt is discharging home today with family to provide supervision/assist.  Reasons for discharge: discharge from hospital Patient/family  agrees with progress made and goals achieved: Yes  Verna Desrocher 08/23/2013, 8:13 AM

## 2013-08-23 NOTE — Progress Notes (Signed)
Subjective/Complaints: 62 y.o. female with history of DDD with gait disorder and prior cervical fusion C 4-C6. 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 Jeral Fruit. Post op lethargy imporving but patient continues with poor safety awareness, unsteady gait as well as numbness in hands  Sitting eob, eating breakfast. No complaints. Slept well last night  Review of Systems -  Objective: Vital Signs: Blood pressure 130/80, pulse 61, temperature 97.8 F (36.6 C), temperature source Oral, resp. rate 18, weight 93.078 kg (205 lb 3.2 oz), SpO2 97.00%. No results found. Results for orders placed during the hospital encounter of 08/13/13 (from the past 72 hour(s))  CBC     Status: Abnormal   Collection Time    08/23/13  5:07 AM      Result Value Range   WBC 13.6 (*) 4.0 - 10.5 K/uL   RBC 4.87  3.87 - 5.11 MIL/uL   Hemoglobin 13.5  12.0 - 15.0 g/dL   HCT 29.5  62.1 - 30.8 %   MCV 85.6  78.0 - 100.0 fL   MCH 27.7  26.0 - 34.0 pg   MCHC 32.4  30.0 - 36.0 g/dL   RDW 65.7 (*) 84.6 - 96.2 %   Platelets 274  150 - 400 K/uL     HEENT: normal and cervical collar Cardio: RRR and no murmurs Resp: CTA B/L and unlabored GI: BS positive and non tender, non distended Extremity:  Pulses positive and No Edema Skin:   Intact and Wound C/D/I and Left ant neck, Steri-Strips peeling off. No evidence of drainage Neuro: Alert/Oriented, Cranial Nerve II-XII normal, Abnormal Sensory reduced sensation to LT left C6,7,8 dermatomes as well as lower extremities/trunk to a lesser extent.  Abnormal Motor 4-/5 Bilateral delt bi tri, 3+ to 4- grip, 4 BLE and Abnormal FMC, but much improved with finger to nose, alternating fingers, and fed herself quite easily. Musc/Skel: neck appropriately tender Gen NAD   Assessment/Plan: 1. Functional deficits secondary to C3-4 cervical  myelopathy, s/p decompression and fusion which require 3+ hours per day of interdisciplinary therapy in a comprehensive inpatient rehab setting. Physiatrist is providing close team supervision and 24 hour management of active medical problems listed below. Physiatrist and rehab team continue to assess barriers to discharge/monitor patient progress toward functional and medical goals.   reviewed prognosis again with pt. i will see her in one month for follow up.   FIM: FIM - Bathing Bathing Steps Patient Completed: Chest;Right Arm;Left Arm;Abdomen;Front perineal area;Buttocks;Right upper leg;Left upper leg;Right lower leg (including foot);Left lower leg (including foot) Bathing: 5: Supervision: Safety issues/verbal cues  FIM - Upper Body Dressing/Undressing Upper body dressing/undressing steps patient completed: Thread/unthread right bra strap;Thread/unthread left bra strap;Thread/unthread right sleeve of pullover shirt/dresss;Thread/unthread left sleeve of pullover shirt/dress;Put head through opening of pull over shirt/dress;Pull shirt over trunk Upper body dressing/undressing: 4: Min-Patient completed 75 plus % of tasks FIM - Lower Body Dressing/Undressing Lower body dressing/undressing steps patient completed: Thread/unthread right underwear leg;Thread/unthread left underwear leg;Pull underwear up/down;Thread/unthread right pants leg;Thread/unthread left pants leg;Pull pants up/down;Fasten/unfasten pants;Don/Doff right sock;Don/Doff left sock;Don/Doff right shoe;Don/Doff left shoe;Fasten/unfasten right shoe;Fasten/unfasten left shoe Lower body dressing/undressing: 6: More than reasonable amount of time  FIM - Toileting Toileting steps completed by patient: Adjust clothing prior to toileting;Performs perineal hygiene;Adjust clothing after toileting Toileting: 4: Steadying assist  FIM - Best boy  Assistive Devices: Art gallery manager Transfers: 5-To toilet/BSC: Supervision  (verbal cues/safety issues)  FIM - Banker Devices: Therapist, occupational: 5: Chair or W/C > Bed: Supervision (verbal cues/safety issues)  FIM - Locomotion: Wheelchair Distance: 15 Locomotion: Wheelchair: 0: Activity did not occur FIM - Locomotion: Ambulation Locomotion: Ambulation Assistive Devices: Designer, industrial/product Ambulation/Gait Assistance: 5: Supervision Locomotion: Ambulation: 5: Travels 150 ft or more with supervision/safety issues  Comprehension Comprehension Mode: Auditory Comprehension: 5-Understands basic 90% of the time/requires cueing < 10% of the time  Expression Expression Mode: Verbal Expression: 5-Expresses basic 90% of the time/requires cueing < 10% of the time.  Social Interaction Social Interaction: 6-Interacts appropriately with others with medication or extra time (anti-anxiety, antidepressant).  Problem Solving Problem Solving: 5-Solves basic 90% of the time/requires cueing < 10% of the time  Memory Memory: 5-Recognizes or recalls 90% of the time/requires cueing < 10% of the time Medical Problem List and Plan:  1. DVT Prophylaxis/Anticoagulation: Pharmaceutical: Lovenox--dc  2. Pain Management: Continue decadron-- pepcid for GI prophylaxis. Prn oxycodone effective.  3. Mood: team to provide egosupport. ?antidepressant. --not indicated at this point 4. Neuropsych: This patient is capable of making decisions on her own behalf.  5. Constipation: Augmented bowel program.  6. Leukocytosis: . Likely dexamethasone related--recheck today down to 13.6 7. No evidence of neurogenic bowel or bladder   LOS (Days) 10 A FACE TO FACE EVALUATION WAS PERFORMED  SWARTZ,ZACHARY T 08/23/2013, 7:53 AM

## 2013-08-23 NOTE — Progress Notes (Signed)
Social Work  Discharge Note  The overall goal for the admission was met for:   Discharge location: Yes - home with intermittent assist from husband  Length of Stay: Yes - 10 days  Discharge activity level: Yes - modified independent to supervision  Home/community participation: Yes  Services provided included: MD, RD, PT, OT, RN, TR, Pharmacy, Neuropsych and SW  Financial Services: Private Insurance: [facility] and [employer]  Follow-up services arranged: Home Health: PT, OT via [facility], DME: rolling walker and 3n1 commode via [facility] and Patient/Family has no preference for HH/DME agencies  Comments (or additional information):  Patient/Family verbalized understanding of follow-up arrangements: Yes  Individual responsible for coordination of the follow-up plan: patient  Confirmed correct DME delivered: Vernia Teem 08/23/2013    Hilman Kissling

## 2013-08-23 NOTE — Progress Notes (Signed)
Pt discharged at [phone] with family to home. Discharge instructions given by Deatra Ina, PA with verbal understanding. Belongings with pt.

## 2013-08-24 DIAGNOSIS — D72829 Elevated white blood cell count, unspecified: Secondary | ICD-10-CM | POA: Diagnosis present

## 2013-08-24 NOTE — Discharge Summary (Signed)
Physician Discharge Summary  Patient ID: Shelia Davis MRN: [id number] DOB/AGE: 05-13-1951 62 y.o.  Admit date: 08/13/2013 Discharge date: 08/23/2013  Discharge Diagnoses:  Principal Problem:   Spondylosis, cervical, with myelopathy Active Problems:   Post op constipation   Leucocytosis   Discharged Condition: stable   Significant Diagnostic Studies: Dg Cervical Spine 2-3 Views  08/07/2013   CLINICAL DATA:  62 year old female undergoing cervical spine surgery. Initial encounter.  EXAM: CERVICAL SPINE - 2-3 VIEW  COMPARISON:  None.  FINDINGS: Intraoperative portable cross-table lateral views of the cervical spine.  Film at 0834 hrs. Needle directed at the C3-C4 disc space. Underlying C4-C5 interbody fusion.  Film at 0943 hrs.  C3-C4 ACDF hardware in place.  IMPRESSION: Cephalad extension of cervical fusion to the C3-C4 level.   Electronically Signed   By: Augusto Gamble M.D.   On: 08/07/2013 10:34    Labs:  Basic Metabolic Panel:    Component Value Date/Time   NA 137 08/17/2013 0807   K 4.1 08/17/2013 0807   CL 97 08/17/2013 0807   CO2 26 08/17/2013 0807   GLUCOSE 119* 08/17/2013 0807   BUN 21 08/17/2013 0807   CREATININE 0.68 08/17/2013 0807   CALCIUM 9.1 08/17/2013 0807   GFRNONAA >90 08/17/2013 0807   GFRAA >90 08/17/2013 0807      CBC:  Recent Labs Lab 08/23/13 0507  WBC 13.6*  HGB 13.5  HCT 41.7  MCV 85.6  PLT 274    CBG: No results found for this basename: GLUCAP,  in the last 168 hours  Brief HPI:   Shelia Davis is a 62 y.o. female with history of DDD with gait disorder and prior cervical fusion C 4-C6. 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 Jeral Fruit. Post op lethargy imporving but patient continues with poor safety awareness, unsteady gait as well as numbness in hands. She had syncopal episode past surgery and  BP stabilizing and she is on steroid taper. Patient admitted to [facility] for progressive therapies.    Hospital Course: INDEA DEARMAN was admitted to rehab 08/13/2013 for inpatient therapies to consist of PT, ST and OT at least three hours five days a week. Past admission physiatrist, therapy team and rehab RN have worked together to provide customized collaborative inpatient rehab. Cervical incision has healed well without signs or symptoms of infection. Blood pressures have been well controlled and mentation has improved greatly. Steroids are on slow wean and to be d/c in four days.  Labs done revealed leucocytosis due to steroid effect. This is down from 26.5 to 13.6 at discharge. Urine culture done was negative and patient has been afebrile.   She continues to have reduce sensation left C6-8 as well as lower extremities to lesser extent. Ataxia and abnormal FMC has improved and she is able to feed herself easily. BUE motor strength is  4-/5 deltoids, biceps, triceps and 3+to 4- grips. BLE strength i is 4/5 and right knee hyperextension brace was ordered to help with stability.    Rehab course: During patient's stay in rehab weekly team conferences were held to monitor patient's progress, set goals and discuss barriers to discharge. Patient has had improvement in activity tolerance, balance, postural control, as well as ability to compensate for deficits. She is has had improvement in functional use BUE s well as improved awareness  and improved coordination. Occupational therapy has focused on neuromuscular reeducation of UE as well as ADL tasks and endurance. She is at modified independent level for ADL and supervision level for functional transfers.  Physical therapy has focused on bed mobility, transfers, and mobility. She requires supervision  for transfers, stair navigation as well as ambulation of 150 feet due to safety concerns. Family education was done with husband who will provide supervision past  discharge.    Disposition: 01-Home or Self Care  Diet: Regular.   Special Instructions: 1. [facility] to provide PT, OT.  2. NO driving or strenuous activity till cleared by MD.       Future Appointments Provider Department Dept Phone   10/02/2013 11:40 AM Ranelle Oyster, MD [facility] [phone]       Medication List    STOP taking these medications       alendronate 40 MG tablet  Commonly known as:  FOSAMAX     HYDROcodone-acetaminophen 5-325 MG per tablet  Commonly known as:  NORCO/VICODIN     ibuprofen 100 MG tablet  Commonly known as:  ADVIL,MOTRIN     ondansetron 4 MG disintegrating tablet  Commonly known as:  ZOFRAN ODT     ondansetron 4 MG/2ML Soln injection  Commonly known as:  ZOFRAN     oxyCODONE-acetaminophen 5-325 MG per tablet  Commonly known as:  PERCOCET/ROXICET     polyethylene glycol packet  Commonly known as:  MIRALAX / GLYCOLAX      TAKE these medications       acetaminophen 325 MG tablet  Commonly known as:  TYLENOL  Take 2 tablets (650 mg total) by mouth every 6 (six) hours as needed for mild pain.     dexamethasone 2 MG tablet  Commonly known as:  DECADRON  Take 1 tablet (2 mg total) by mouth daily.     ergocalciferol 50000 UNITS capsule  Commonly known as:  VITAMIN D2  Take 50,000 Units by mouth once a week.     loratadine 10 MG tablet  Commonly known as:  CLARITIN  Take 10 mg by mouth daily.     methocarbamol 500 MG tablet  Commonly known as:  ROBAXIN  Take 1-1.5 tablets (500-750 mg total) by mouth 4 (four) times daily as needed for muscle spasms.     senna 8.6 MG Tabs tablet  Commonly known as:  SENOKOT  Take 1 tablet (8.6 mg total) by mouth 2 (two) times daily.     traMADol 50 MG tablet  Commonly known as:  ULTRAM  Take 1 tablet (50 mg total) by mouth every 6 (six) hours as needed for moderate pain or severe pain.       Follow-up Information   Follow up with  Ranelle Oyster, MD On 10/02/2013. (Be there at 11:15 am      for     appointment)    Specialty:  Physical Medicine and Rehabilitation   Contact information:   [address], [address] [place] [address] [phone]       Follow up with [name], MD. Call today. (for follow up appointment in 2-3 weeks)    Specialty:  Neurosurgery   Contact information:   [address] [place] [address] [phone]       Follow up with Sissy Hoff, MD On 09/03/2013. (@ 11:45 am)    Specialty:  Family Medicine  Contact information:   [address] [place] [address] [phone]       Signed: Jacquelynn Cree 08/24/2013, 5:11 PM

## 2013-09-24 ENCOUNTER — Ambulatory Visit: Payer: 59 | Attending: Neurosurgery | Admitting: Physical Therapy

## 2013-09-24 DIAGNOSIS — IMO0001 Reserved for inherently not codable concepts without codable children: Secondary | ICD-10-CM | POA: Insufficient documentation

## 2013-09-24 DIAGNOSIS — M542 Cervicalgia: Secondary | ICD-10-CM | POA: Insufficient documentation

## 2013-09-27 ENCOUNTER — Ambulatory Visit: Payer: 59 | Admitting: Physical Therapy

## 2013-09-27 DIAGNOSIS — IMO0001 Reserved for inherently not codable concepts without codable children: Secondary | ICD-10-CM | POA: Diagnosis not present

## 2013-10-01 ENCOUNTER — Ambulatory Visit: Payer: 59 | Admitting: Physical Therapy

## 2013-10-01 DIAGNOSIS — IMO0001 Reserved for inherently not codable concepts without codable children: Secondary | ICD-10-CM | POA: Diagnosis not present

## 2013-10-02 ENCOUNTER — Encounter: Payer: Self-pay | Admitting: Physical Medicine & Rehabilitation

## 2013-10-02 ENCOUNTER — Encounter: Payer: 59 | Attending: Physical Medicine & Rehabilitation | Admitting: Physical Medicine & Rehabilitation

## 2013-10-02 VITALS — HR 87 | Resp 16 | Ht 65.5 in | Wt 212.0 lb

## 2013-10-02 DIAGNOSIS — K5900 Constipation, unspecified: Secondary | ICD-10-CM

## 2013-10-02 DIAGNOSIS — Z981 Arthrodesis status: Secondary | ICD-10-CM | POA: Insufficient documentation

## 2013-10-02 DIAGNOSIS — M25519 Pain in unspecified shoulder: Secondary | ICD-10-CM | POA: Diagnosis not present

## 2013-10-02 DIAGNOSIS — Z79899 Other long term (current) drug therapy: Secondary | ICD-10-CM | POA: Diagnosis not present

## 2013-10-02 DIAGNOSIS — M719 Bursopathy, unspecified: Secondary | ICD-10-CM

## 2013-10-02 DIAGNOSIS — M67919 Unspecified disorder of synovium and tendon, unspecified shoulder: Secondary | ICD-10-CM

## 2013-10-02 DIAGNOSIS — M4712 Other spondylosis with myelopathy, cervical region: Secondary | ICD-10-CM | POA: Insufficient documentation

## 2013-10-02 DIAGNOSIS — IMO0001 Reserved for inherently not codable concepts without codable children: Secondary | ICD-10-CM | POA: Insufficient documentation

## 2013-10-02 DIAGNOSIS — M75100 Unspecified rotator cuff tear or rupture of unspecified shoulder, not specified as traumatic: Secondary | ICD-10-CM

## 2013-10-02 MED ORDER — TRAMADOL HCL 50 MG PO TABS: 50.0000 mg | ORAL_TABLET | Freq: Four times a day (QID) | ORAL | Status: DC | PRN

## 2013-10-02 MED ORDER — MELOXICAM 7.5 MG PO TABS: 7.5000 mg | ORAL_TABLET | Freq: Every day | ORAL | Status: AC

## 2013-10-02 MED ORDER — METHOCARBAMOL 500 MG PO TABS: 500.0000 mg | ORAL_TABLET | Freq: Four times a day (QID) | ORAL | Status: DC | PRN

## 2013-10-02 NOTE — Patient Instructions (Signed)
CONTINUE TO WORK ON YOUR RANGE OF MOTION AND STRENGTH AT HOME.

## 2013-10-02 NOTE — Progress Notes (Signed)
Subjective:    Patient ID: Shelia Davis, female    DOB: April 14, 1951, 63 y.o.   MRN: [id number]  HPI  Shelia Davis is back regarding her cervical myelopathy. She has been home since early December. She received HH and now has been advanced to outpt therapies at [facility] over the last couple weeks. She feels quite "sore" from therapies yesterday over there.  She has spasms in her neck and shoulders with pain as well. She uses robaxin twice daily typically and her ultram the same. She is using tylenol 325mg  4-6 x per day.   She is walking without a device. Her husband is always with her when she ambulates. They try walking at the store, going out to dinner, etc. She will use a cart for support when at the grocery store.  She sometimes gets a little "wobbly" but for the most part is doing quite well. She denies falling but has had a couple near misses.   Bowel and bladder function have been intact.     Pain Inventory Average Pain 4 Pain Right Now 3 My pain is intermittent and aching  In the last 24 hours, has pain interfered with the following? General activity 4 Relation with others 4 Enjoyment of life 4 What TIME of day is your pain at its worst? night Sleep (in general) Poor  Pain is worse with: walking, bending and some activites Pain improves with: rest and heat/ice Relief from Meds: n/a  Mobility walk without assistance how many minutes can you walk? 30 ability to climb steps?  yes do you drive?  no transfers alone Do you have any goals in this area?  yes  Function not employed: date last employed 08/07/13 I need assistance with the following:  bathing, meal prep, household duties and shopping  Neuro/Psych weakness loss of taste or smell  Prior Studies Any changes since last visit?  no  Physicians involved in your care Any changes since last visit?  no   History reviewed. No pertinent family history. History   Social History  . Marital Status: Married   Spouse Name: N/A    Number of Children: N/A  . Years of Education: N/A   Social History Main Topics  . Smoking status: Never Smoker   . Smokeless tobacco: None  . Alcohol Use: No  . Drug Use: No  . Sexual Activity: None   Other Topics Concern  . None   Social History Narrative  . None   Past Surgical History  Procedure Laterality Date  . Cholecystectomy    . Abdominal hysterectomy    . Neck fusion    . Falls      numerous breaks from falls,wrist X3, fingers,lt leg,ankle,foot  . Fracture surgery      freq. falls  . Anterior cervical decomp/discectomy fusion N/A 08/07/2013    Procedure: CERVICAL THREE-FOUR ANTERIOR CERVICAL DECOMPRESSION/DISCECTOMY FUSION 1 LEVEL;  Surgeon: Floyce Stakes, MD;  Location: [facility];  Service: Neurosurgery;  Laterality: N/A;  C3-4 Anterior cervical decompression/diskectomy/fusion   Past Medical History  Diagnosis Date  . Osteoarthritis   . PONV (postoperative nausea and vomiting)    BP   Pulse 87  Resp 16  Ht 5' 5.5" (1.664 m)  Wt 212 lb (96.163 kg)  BMI 34.73 kg/m2  SpO2 97%     Review of Systems  Constitutional: Positive for appetite change.  Neurological: Positive for weakness.  All other systems reviewed and are negative.  Objective:   Physical Exam  HEENT: normal and cervical collar  Cardio: RRR and no murmurs  Resp: CTA B/L and unlabored  GI: BS positive and non tender, non distended  Extremity: Pulses positive and No Edema  Skin: Intact and Wound C/D/I and Left ant neck, Steri-Strips peeling off. No evidence of drainage  Neuro: Alert/Oriented, Cranial Nerve II-XII normal,  diminsihed pp and lt from shoulders to feet. Left > right shoulders with more substantial loss and dysesthesias. Left shoulder 3-, right shouldef 3 to 3+, biceps and triceps are 3+ to 4. Wrist and HI are 3+ to 4-. LE's are grossly 3+ to 4-. dtr's are hyperactive in all 4. Walks with shuffling gait pattern. Not steady without hand held  assist.  Musc: pain in both shoulder with palpation and with rtc maneuvers. Anterior shoulder with minimal pain. Gen NAD    Assessment/Plan:  1. Functional deficits secondary to C3-4 cervical myelopathy, s/p decompression and fusion  -?probably isolated C4 radics  -continue outpt therapies. Needs to have walker when she's up on her own.  2. Pain Management: add scheduled meloxicam.   -continue with robaxin  -continue with tramadol and tylenol as well. 3. Myofascial pain  -posture reviewed. She's aware that it needs to improve  - 4. Shoulder pain  -likely multifactorial related to the above, probable RTC/bursitis component as well  I will see her back in about 6 weeks. 30 minutes of face to face patient care time were spent during this visit. All questions were encouraged and answered.

## 2013-10-03 ENCOUNTER — Ambulatory Visit: Payer: 59 | Admitting: Physical Therapy

## 2013-10-04 ENCOUNTER — Ambulatory Visit: Payer: 59 | Admitting: Physical Therapy

## 2013-10-06 DIAGNOSIS — Z5189 Encounter for other specified aftercare: Secondary | ICD-10-CM | POA: Diagnosis not present

## 2013-10-06 DIAGNOSIS — S42023A Displaced fracture of shaft of unspecified clavicle, initial encounter for closed fracture: Secondary | ICD-10-CM | POA: Diagnosis not present

## 2013-10-06 DIAGNOSIS — Z4789 Encounter for other orthopedic aftercare: Secondary | ICD-10-CM | POA: Diagnosis not present

## 2013-10-08 ENCOUNTER — Ambulatory Visit: Payer: 59 | Admitting: Physical Therapy

## 2013-10-08 DIAGNOSIS — IMO0001 Reserved for inherently not codable concepts without codable children: Secondary | ICD-10-CM | POA: Diagnosis not present

## 2013-10-11 ENCOUNTER — Ambulatory Visit: Payer: 59 | Admitting: Physical Therapy

## 2013-10-11 DIAGNOSIS — IMO0001 Reserved for inherently not codable concepts without codable children: Secondary | ICD-10-CM | POA: Diagnosis not present

## 2013-10-15 ENCOUNTER — Ambulatory Visit: Payer: 59 | Admitting: Physical Therapy

## 2013-10-15 DIAGNOSIS — IMO0001 Reserved for inherently not codable concepts without codable children: Secondary | ICD-10-CM | POA: Diagnosis not present

## 2013-10-18 ENCOUNTER — Ambulatory Visit: Payer: 59 | Admitting: Physical Therapy

## 2013-10-18 DIAGNOSIS — IMO0001 Reserved for inherently not codable concepts without codable children: Secondary | ICD-10-CM | POA: Diagnosis not present

## 2013-10-22 ENCOUNTER — Ambulatory Visit: Payer: 59 | Attending: Neurosurgery | Admitting: Physical Therapy

## 2013-10-22 DIAGNOSIS — IMO0001 Reserved for inherently not codable concepts without codable children: Secondary | ICD-10-CM | POA: Insufficient documentation

## 2013-10-22 DIAGNOSIS — M542 Cervicalgia: Secondary | ICD-10-CM | POA: Diagnosis not present

## 2013-10-25 ENCOUNTER — Ambulatory Visit: Payer: 59 | Admitting: Physical Therapy

## 2013-10-25 DIAGNOSIS — IMO0001 Reserved for inherently not codable concepts without codable children: Secondary | ICD-10-CM | POA: Diagnosis not present

## 2013-10-29 ENCOUNTER — Ambulatory Visit: Payer: 59 | Admitting: Physical Therapy

## 2013-10-29 DIAGNOSIS — IMO0001 Reserved for inherently not codable concepts without codable children: Secondary | ICD-10-CM | POA: Diagnosis not present

## 2013-11-01 ENCOUNTER — Ambulatory Visit: Payer: 59 | Admitting: Physical Therapy

## 2013-11-01 DIAGNOSIS — IMO0001 Reserved for inherently not codable concepts without codable children: Secondary | ICD-10-CM | POA: Diagnosis not present

## 2013-11-05 ENCOUNTER — Ambulatory Visit: Payer: 59 | Admitting: Physical Therapy

## 2013-11-07 ENCOUNTER — Ambulatory Visit: Payer: 59 | Admitting: Physical Therapy

## 2013-11-07 DIAGNOSIS — IMO0001 Reserved for inherently not codable concepts without codable children: Secondary | ICD-10-CM | POA: Diagnosis not present

## 2013-11-08 ENCOUNTER — Ambulatory Visit: Payer: 59 | Admitting: Physical Therapy

## 2013-11-12 ENCOUNTER — Ambulatory Visit: Payer: 59 | Admitting: Physical Therapy

## 2013-11-12 ENCOUNTER — Encounter: Payer: Self-pay | Admitting: Physical Medicine & Rehabilitation

## 2013-11-12 ENCOUNTER — Encounter: Payer: 59 | Attending: Physical Medicine & Rehabilitation | Admitting: Physical Medicine & Rehabilitation

## 2013-11-12 VITALS — BP 159/93 | HR 74 | Resp 14 | Ht 65.5 in | Wt 210.0 lb

## 2013-11-12 DIAGNOSIS — M4712 Other spondylosis with myelopathy, cervical region: Secondary | ICD-10-CM

## 2013-11-12 DIAGNOSIS — M719 Bursopathy, unspecified: Secondary | ICD-10-CM

## 2013-11-12 DIAGNOSIS — IMO0001 Reserved for inherently not codable concepts without codable children: Secondary | ICD-10-CM | POA: Diagnosis not present

## 2013-11-12 DIAGNOSIS — M67919 Unspecified disorder of synovium and tendon, unspecified shoulder: Secondary | ICD-10-CM

## 2013-11-12 DIAGNOSIS — M75100 Unspecified rotator cuff tear or rupture of unspecified shoulder, not specified as traumatic: Secondary | ICD-10-CM

## 2013-11-12 MED ORDER — BACLOFEN 10 MG PO TABS: 10.0000 mg | ORAL_TABLET | Freq: Three times a day (TID) | ORAL | Status: DC | PRN

## 2013-11-12 NOTE — Patient Instructions (Signed)
YOU ARE MAKING PROGRESS. CONTINUE TO WORK ON ON YOUR STRENGTH, BALANCE, AND COORDINATION.

## 2013-11-12 NOTE — Progress Notes (Signed)
Subjective:    Patient ID: Shelia Davis, female    DOB: 01/26/1951, 63 y.o.   MRN: [id number]  HPI  Shelia Davis is back regarding her cervical myelopathy. She feels stronger. Her right shoulder still is tender. She feels her neck "pop" still. She is working with PT at [facility]. They have been predominantly working on her arms. Her legs feel weaker. She saw her NS who ordered new scans of her neck apparently for next week. She has also noticed tremor in her head as well as her hands/arms. Her hands are still numb.   She revealed to me at the end of her visit that her daughter was killed a few days after she saw me. This had a profound impact and sent her backwards. Her husband feels that she has been doing better over the last couple weeks.   She denies any bowel or bladder issues.   Pain Inventory Average Pain 5 Pain Right Now 3 My pain is dull and aching  In the last 24 hours, has pain interfered with the following? General activity 5 Relation with others 5 Enjoyment of life 5 What TIME of day is your pain at its worst? evening Sleep (in general) Poor  Pain is worse with: walking, bending, sitting and standing Pain improves with: rest, heat/ice and medication Relief from Meds: n/a  Mobility walk with assistance use a cane use a walker how many minutes can you walk? 10 ability to climb steps?  yes do you drive?  no  Function disabled: date disabled 07/2013 I need assistance with the following:  meal prep, household duties and shopping  Neuro/Psych weakness numbness tremor tingling trouble walking spasms  Prior Studies x-rays  Physicians involved in your care Neurosurgeon .   History reviewed. No pertinent family history. History   Social History  . Marital Status: Married    Spouse Name: N/A    Number of Children: N/A  . Years of Education: N/A   Social History Main Topics  . Smoking status: Never Smoker   . Smokeless tobacco: None  . Alcohol Use: No   . Drug Use: No  . Sexual Activity: None   Other Topics Concern  . None   Social History Narrative  . None   Past Surgical History  Procedure Laterality Date  . Cholecystectomy    . Abdominal hysterectomy    . Neck fusion    . Falls      numerous breaks from falls,wrist X3, fingers,lt leg,ankle,foot  . Fracture surgery      freq. falls  . Anterior cervical decomp/discectomy fusion N/A 08/07/2013    Procedure: CERVICAL THREE-FOUR ANTERIOR CERVICAL DECOMPRESSION/DISCECTOMY FUSION 1 LEVEL;  Surgeon: Floyce Stakes, MD;  Location: [facility];  Service: Neurosurgery;  Laterality: N/A;  C3-4 Anterior cervical decompression/diskectomy/fusion   Past Medical History  Diagnosis Date  . Osteoarthritis   . PONV (postoperative nausea and vomiting)    BP 159/93  Pulse 74  Resp 14  Ht 5' 5.5" (1.664 m)  Wt 210 lb (95.255 kg)  BMI 34.40 kg/m2  SpO2 97%  Opioid Risk Score:   Fall Risk Score: Moderate Fall Risk (6-13 points) (pateint educated handout given)   Review of Systems  Musculoskeletal: Positive for gait problem.  Neurological: Positive for tremors, weakness and numbness.  All other systems reviewed and are negative.       Objective:   Physical Exam  HEENT: normal and cervical collar  Cardio: RRR and no murmurs  Resp: CTA B/L and unlabored  GI: BS positive and non tender, non distended  Extremity: Pulses positive and No Edema  Skin: Intact and Wound C/D/I and Left ant neck, Steri-Strips peeling off. No evidence of drainage  Neuro: Alert/Oriented, Cranial Nerve II-XII normal, diminsihed pp and lt from shoulders to feet. Left > right shoulders with more substantial loss and dysesthesias. Left shoulder 3, right shoulder  3+, biceps and triceps are 4- to  4. Wrist and HI are 3+ to 4-. Still . LE's are grossly 3+ to 4-. dtr's are hyperactive in all 4. Walks with better gait pattern. Used cane today.   Musc: pain in both shoulder with palpation and with rtc maneuvers.  Anterior shoulder with minimal pain. Phalen's test equivocal Gen NAD    Assessment/Plan:  1. 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 Follow imagine per surgery. I think the death of her daughter had a huge impact.  -?probably isolated C4 radics. Perhaps overlapping CTS? -continue outpt therapies. They  Need to work on her lower extremity strength and balance. She HAS improved from our last visit despite the loss of her daughter.  2. Pain Management:  Tremors are likely born out of fatigue. She is having occasional cramping also..  -continue with robaxin  -continue with tramadol and tylenol as well.  3. Myofascial pain  -posture reviewed again. She has improved this a lot.  4. Recent death of daughter -family is providing support. -contact me if needed  5. Follow up with me in about 3 months. 30 minutes of face to face patient care time were spent during this visit. All questions were encouraged and answered. Substantial education and reassurance was provided.  Do think she is appropriate for a disability application given her ongoing deficits which will be chronic.

## 2013-11-15 ENCOUNTER — Ambulatory Visit: Payer: 59 | Admitting: Physical Therapy

## 2013-11-19 ENCOUNTER — Ambulatory Visit: Payer: 59 | Attending: Neurosurgery | Admitting: Physical Therapy

## 2013-11-19 DIAGNOSIS — IMO0001 Reserved for inherently not codable concepts without codable children: Secondary | ICD-10-CM | POA: Diagnosis present

## 2013-11-19 DIAGNOSIS — M542 Cervicalgia: Secondary | ICD-10-CM | POA: Insufficient documentation

## 2013-11-22 ENCOUNTER — Ambulatory Visit: Payer: 59 | Admitting: Physical Therapy

## 2013-11-22 DIAGNOSIS — IMO0001 Reserved for inherently not codable concepts without codable children: Secondary | ICD-10-CM | POA: Diagnosis not present

## 2013-11-26 ENCOUNTER — Ambulatory Visit: Payer: 59 | Admitting: Physical Therapy

## 2013-11-26 DIAGNOSIS — IMO0001 Reserved for inherently not codable concepts without codable children: Secondary | ICD-10-CM | POA: Diagnosis not present

## 2013-11-29 ENCOUNTER — Ambulatory Visit: Payer: 59 | Admitting: Physical Therapy

## 2013-11-29 DIAGNOSIS — IMO0001 Reserved for inherently not codable concepts without codable children: Secondary | ICD-10-CM | POA: Diagnosis not present

## 2013-12-03 ENCOUNTER — Ambulatory Visit: Payer: 59 | Admitting: Physical Therapy

## 2013-12-03 DIAGNOSIS — IMO0001 Reserved for inherently not codable concepts without codable children: Secondary | ICD-10-CM | POA: Diagnosis not present

## 2013-12-06 ENCOUNTER — Ambulatory Visit: Payer: 59 | Admitting: Physical Therapy

## 2013-12-10 ENCOUNTER — Ambulatory Visit: Payer: 59 | Admitting: Physical Therapy

## 2013-12-10 DIAGNOSIS — IMO0001 Reserved for inherently not codable concepts without codable children: Secondary | ICD-10-CM | POA: Diagnosis not present

## 2013-12-13 ENCOUNTER — Ambulatory Visit: Payer: 59 | Admitting: Physical Therapy

## 2013-12-17 ENCOUNTER — Ambulatory Visit: Payer: 59 | Admitting: Physical Therapy

## 2013-12-17 DIAGNOSIS — IMO0001 Reserved for inherently not codable concepts without codable children: Secondary | ICD-10-CM | POA: Diagnosis not present

## 2013-12-20 ENCOUNTER — Ambulatory Visit: Payer: 59 | Admitting: Physical Therapy

## 2013-12-24 ENCOUNTER — Ambulatory Visit: Payer: 59 | Attending: Neurosurgery | Admitting: Physical Therapy

## 2013-12-24 DIAGNOSIS — M542 Cervicalgia: Secondary | ICD-10-CM | POA: Insufficient documentation

## 2013-12-24 DIAGNOSIS — IMO0001 Reserved for inherently not codable concepts without codable children: Secondary | ICD-10-CM | POA: Insufficient documentation

## 2013-12-27 ENCOUNTER — Ambulatory Visit: Payer: 59 | Admitting: Physical Therapy

## 2013-12-27 DIAGNOSIS — IMO0001 Reserved for inherently not codable concepts without codable children: Secondary | ICD-10-CM | POA: Diagnosis not present

## 2013-12-31 ENCOUNTER — Ambulatory Visit: Payer: 59 | Admitting: Physical Therapy

## 2013-12-31 DIAGNOSIS — IMO0001 Reserved for inherently not codable concepts without codable children: Secondary | ICD-10-CM | POA: Diagnosis not present

## 2014-01-03 ENCOUNTER — Ambulatory Visit: Payer: 59 | Admitting: Physical Therapy

## 2014-01-03 DIAGNOSIS — IMO0001 Reserved for inherently not codable concepts without codable children: Secondary | ICD-10-CM | POA: Diagnosis not present

## 2014-01-07 ENCOUNTER — Ambulatory Visit: Payer: 59 | Admitting: Physical Therapy

## 2014-01-07 DIAGNOSIS — IMO0001 Reserved for inherently not codable concepts without codable children: Secondary | ICD-10-CM | POA: Diagnosis not present

## 2014-01-10 ENCOUNTER — Ambulatory Visit: Payer: 59 | Admitting: Physical Therapy

## 2014-01-10 DIAGNOSIS — IMO0001 Reserved for inherently not codable concepts without codable children: Secondary | ICD-10-CM | POA: Diagnosis not present

## 2014-01-14 ENCOUNTER — Ambulatory Visit: Payer: 59 | Admitting: Physical Therapy

## 2014-01-14 DIAGNOSIS — IMO0001 Reserved for inherently not codable concepts without codable children: Secondary | ICD-10-CM | POA: Diagnosis not present

## 2014-01-17 ENCOUNTER — Ambulatory Visit: Payer: 59 | Admitting: Physical Therapy

## 2014-01-21 ENCOUNTER — Ambulatory Visit: Payer: BC Managed Care – PPO | Admitting: Physical Therapy

## 2014-01-23 ENCOUNTER — Ambulatory Visit: Payer: BC Managed Care – PPO | Attending: Neurosurgery | Admitting: Physical Therapy

## 2014-01-23 DIAGNOSIS — M542 Cervicalgia: Secondary | ICD-10-CM | POA: Insufficient documentation

## 2014-01-23 DIAGNOSIS — IMO0001 Reserved for inherently not codable concepts without codable children: Secondary | ICD-10-CM | POA: Insufficient documentation

## 2014-01-24 ENCOUNTER — Ambulatory Visit: Payer: BC Managed Care – PPO | Admitting: Physical Therapy

## 2014-01-28 ENCOUNTER — Ambulatory Visit: Payer: BC Managed Care – PPO | Admitting: Physical Therapy

## 2014-01-31 ENCOUNTER — Ambulatory Visit: Payer: BC Managed Care – PPO | Admitting: Physical Therapy

## 2014-02-04 ENCOUNTER — Encounter: Payer: 59 | Attending: Physical Medicine & Rehabilitation | Admitting: Physical Medicine & Rehabilitation

## 2014-02-04 ENCOUNTER — Ambulatory Visit: Payer: BC Managed Care – PPO | Admitting: Physical Therapy

## 2014-02-04 ENCOUNTER — Encounter: Payer: Self-pay | Admitting: Physical Medicine & Rehabilitation

## 2014-02-04 VITALS — BP 157/97 | HR 87 | Resp 14 | Ht 65.0 in | Wt 209.0 lb

## 2014-02-04 DIAGNOSIS — M67919 Unspecified disorder of synovium and tendon, unspecified shoulder: Secondary | ICD-10-CM

## 2014-02-04 DIAGNOSIS — M75100 Unspecified rotator cuff tear or rupture of unspecified shoulder, not specified as traumatic: Secondary | ICD-10-CM

## 2014-02-04 DIAGNOSIS — Z981 Arthrodesis status: Secondary | ICD-10-CM | POA: Diagnosis not present

## 2014-02-04 DIAGNOSIS — M542 Cervicalgia: Secondary | ICD-10-CM | POA: Insufficient documentation

## 2014-02-04 DIAGNOSIS — M719 Bursopathy, unspecified: Secondary | ICD-10-CM

## 2014-02-04 DIAGNOSIS — M4712 Other spondylosis with myelopathy, cervical region: Secondary | ICD-10-CM | POA: Diagnosis present

## 2014-02-04 MED ORDER — TRAMADOL HCL 50 MG PO TABS: 50.0000 mg | ORAL_TABLET | Freq: Four times a day (QID) | ORAL | Status: AC | PRN

## 2014-02-04 MED ORDER — BACLOFEN 10 MG PO TABS: 10.0000 mg | ORAL_TABLET | Freq: Three times a day (TID) | ORAL | Status: DC | PRN

## 2014-02-04 NOTE — Patient Instructions (Signed)
PLEASE CALL ME WITH ANY PROBLEMS OR QUESTIONS (#[phone]).     WORK ON YOUR POSTURE IN YOUR NECK, SHOULDERS, AND UPPER BACK.

## 2014-02-04 NOTE — Progress Notes (Signed)
Subjective:    Patient ID: Shelia Davis, female    DOB: [birth year], 63 y.o.   MRN: [id number]  HPI  Shelia Davis is back regarding her cervical myelopathy. She has continue to improve neurologically. Dr. Joya Davis is concerned about an area apparently at C3 where the hardwear is near the spinal cord. She is not anxious to pursue any further surgery. She is working with outpatient therapies. She tries to walk daily. Her strength and balance are better.  Her pain is continuing to improve. Her worst pain tends to be in the neck when she is reading and fatigues after looking down at the book for some time.   From a mood standpoint she is still struggling with the loss of her daughter. Her family has been supportive.    Pain Inventory Average Pain 3 Pain Right Now 2 My pain is aching  In the last 24 hours, has pain interfered with the following? General activity 4 Relation with others 4 Enjoyment of life 4 What TIME of day is your pain at its worst? daytime Sleep (in general) Fair  Pain is worse with: walking and standing Pain improves with: rest, heat/ice and therapy/exercise Relief from Meds: 7  Mobility walk with assistance use a cane ability to climb steps?  yes do you drive?  no transfers alone Do you have any goals in this area?  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  yes  Neuro/Psych numbness trouble walking spasms loss of taste or smell  Prior Studies Any changes since last visit?  yes bone scan CT/MRI  Physicians involved in your care Any changes since last visit?  yes Shelia Davis, Shelia Davis   History reviewed. No pertinent family history. History   Social History  . Marital Status: Married    Spouse Name: N/A    Number of Children: N/A  . Years of Education: N/A   Social History Main Topics  . Smoking status: Never Smoker   . Smokeless tobacco: None  . Alcohol Use: No    . Drug Use: No  . Sexual Activity: None   Other Topics Concern  . None   Social History Narrative  . None   Past Surgical History  Procedure Laterality Date  . Cholecystectomy    . Abdominal hysterectomy    . Neck fusion    . Falls      numerous breaks from falls,wrist X3, fingers,lt leg,ankle,foot  . Fracture surgery      freq. falls  . Anterior cervical decomp/discectomy fusion N/A 08/07/2013    Procedure: CERVICAL THREE-FOUR ANTERIOR CERVICAL DECOMPRESSION/DISCECTOMY FUSION 1 LEVEL;  Surgeon: Shelia Stakes, MD;  Location: [facility];  Service: Neurosurgery;  Laterality: N/A;  C3-4 Anterior cervical decompression/diskectomy/fusion   Past Medical History  Diagnosis Date  . Osteoarthritis   . PONV (postoperative nausea and vomiting)    BP 157/97  Pulse 87  Resp 14  Ht 5\' 5"  (1.651 m)  Wt 209 lb (94.802 kg)  BMI 34.78 kg/m2  SpO2 96%  Opioid Risk Score:   Fall Risk Score: Moderate Fall Risk (6-13 points) (pt educated on fall risk, declined brochure)    Review of Systems  HENT:       Loss of smell or taste  Musculoskeletal: Positive for neck pain.  Neurological: Positive for numbness.       Spasms  All other systems reviewed and are negative.  Objective:   Physical Exam   HEENT: normal and cervical collar  Cardio: RRR and no murmurs  Resp: CTA B/L and unlabored  GI: BS positive and non tender, non distended  Extremity: Pulses positive and No Edema  Skin: Intact and Wound C/D/I and Left ant neck, Steri-Strips peeling off. No evidence of drainage  Neuro: Alert/Oriented, Cranial Nerve II-XII normal, diminsihed pp and lt from shoulders to feet. Left > right shoulders with more substantial loss and dysesthesias. Left shoulder 3, right shoulder 3+, biceps and triceps are 4. Wrist and HI are   4-. Still . LE's are grossly 3  4-. dtr's are hyperactive in all 4.  Used cane today and demonstrated excellent balance and weight shift Musc: less shoulder pain  today  Gen NAD  Psych: affect improved---occasionally tearful   Assessment/Plan:  1. 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 Eventually will need to transition to HEP.  2. Pain Management:    -continue with baclofen prn for spasms--no rx written -continue with tramadol on a limtied basis 3. Myofascial pain  -posture reviewed again. Needs to work on better shoulder positioning and head position  4. Recent death of daughter  -family is providing support.  -this is still weighing heavily on her 7. Follow up with me in about 6 months. 30 minutes of face to face patient care time were spent during this visit. All questions were encouraged and answered. She remains totally disabled from vocational activities.

## 2014-02-05 ENCOUNTER — Ambulatory Visit: Payer: BC Managed Care – PPO | Admitting: Physical Therapy

## 2014-02-14 ENCOUNTER — Ambulatory Visit: Payer: BC Managed Care – PPO | Admitting: Physical Therapy

## 2014-02-18 ENCOUNTER — Ambulatory Visit: Payer: BC Managed Care – PPO | Admitting: Physical Therapy

## 2014-02-19 ENCOUNTER — Encounter: Payer: 59 | Admitting: Physical Therapy

## 2014-02-20 ENCOUNTER — Ambulatory Visit: Payer: BC Managed Care – PPO | Admitting: Physical Therapy

## 2014-06-22 IMAGING — CR DG CERVICAL SPINE 2 OR 3 VIEWS
1 series · 1 of 1 positions shown · non-contrast
Comparison: None.

CLINICAL DATA: 62-year-old female undergoing cervical spine
surgery. Initial encounter.

EXAM:
CERVICAL SPINE - 2-3 VIEW

[xtable]
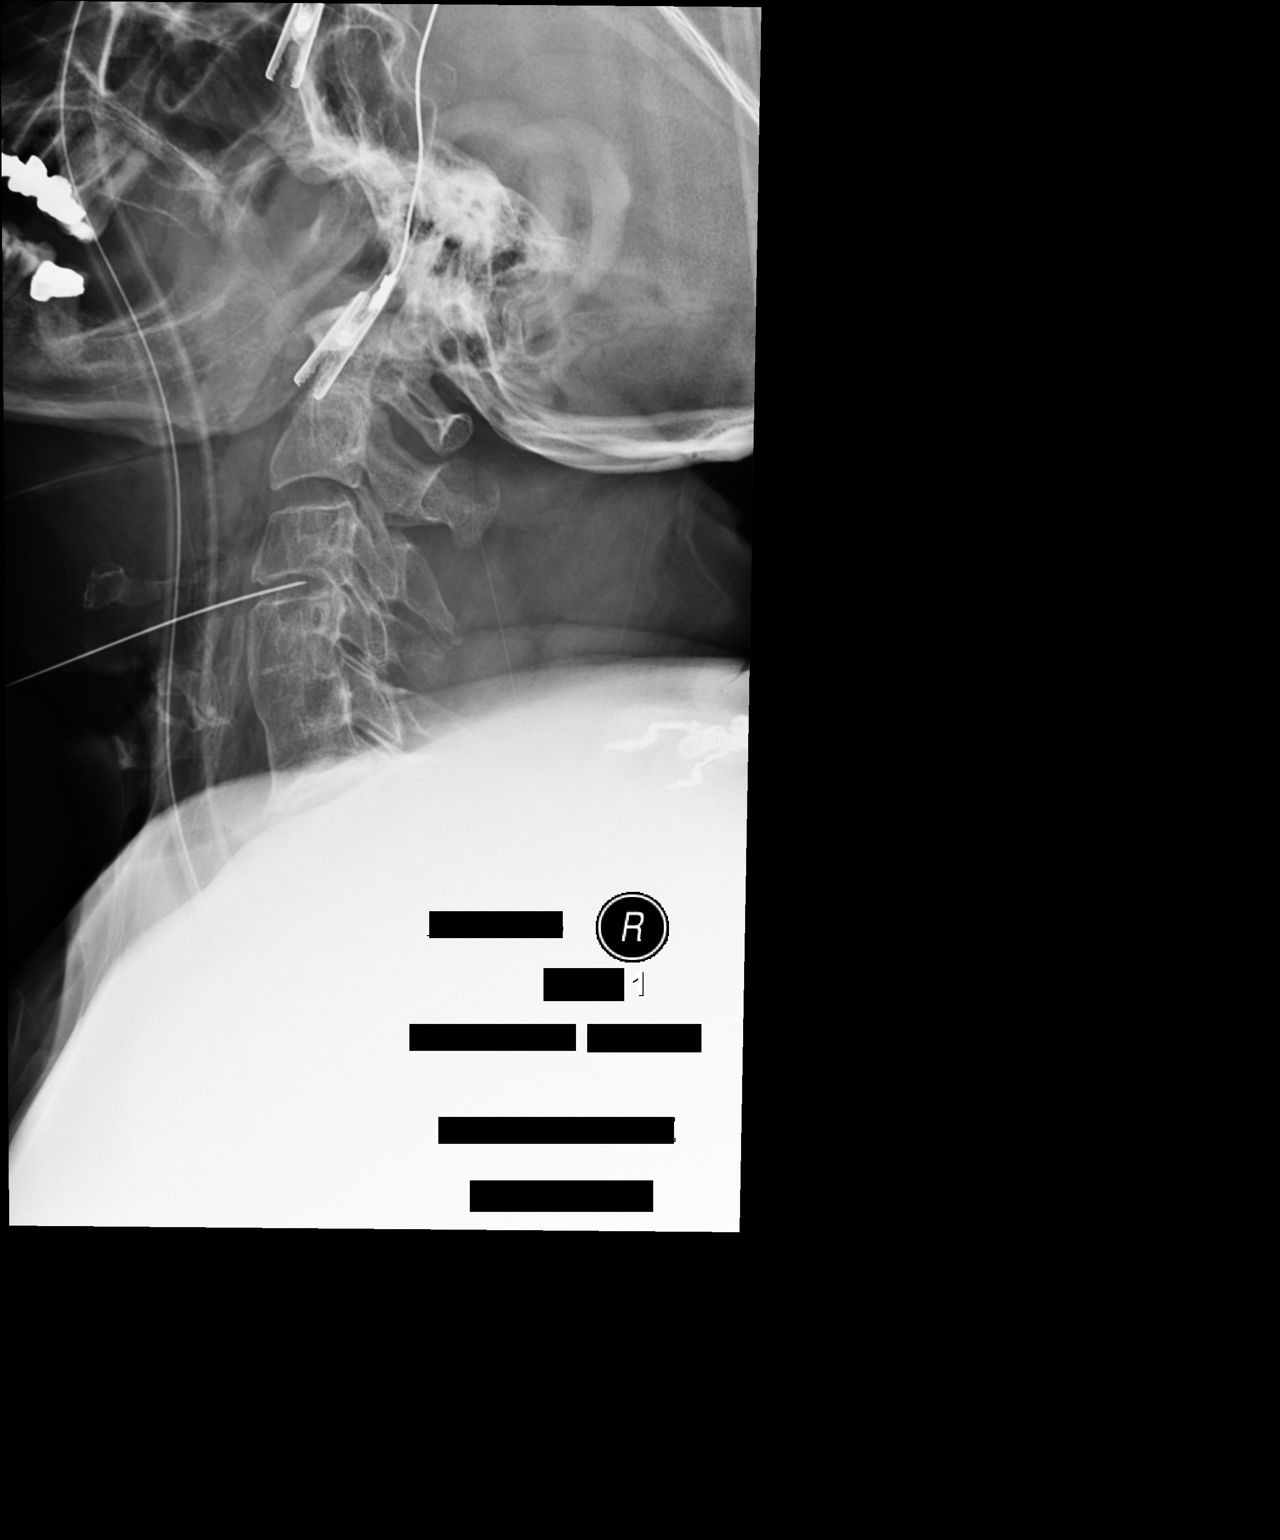

[1 of 1 positions shown; findings below may reference images not displayed]

FINDINGS: Intraoperative portable cross-table lateral views of the cervical
spine.

Film at 6559 hrs. Needle directed at the C3-C4 disc space.
Underlying C4-C5 interbody fusion.

Film at 7629 hrs.  C3-C4 ACDF hardware in place.
IMPRESSION: Cephalad extension of cervical fusion to the C3-C4 level.

## 2014-08-07 ENCOUNTER — Encounter
Payer: BC Managed Care – PPO | Attending: Physical Medicine & Rehabilitation | Admitting: Physical Medicine & Rehabilitation

## 2014-08-07 ENCOUNTER — Encounter: Payer: Self-pay | Admitting: Physical Medicine & Rehabilitation

## 2014-08-07 VITALS — BP 137/70 | HR 88 | Resp 14 | Ht 65.5 in | Wt 202.0 lb

## 2014-08-07 DIAGNOSIS — M25511 Pain in right shoulder: Secondary | ICD-10-CM | POA: Diagnosis not present

## 2014-08-07 DIAGNOSIS — G959 Disease of spinal cord, unspecified: Secondary | ICD-10-CM | POA: Diagnosis present

## 2014-08-07 DIAGNOSIS — M4712 Other spondylosis with myelopathy, cervical region: Secondary | ICD-10-CM | POA: Diagnosis not present

## 2014-08-07 DIAGNOSIS — M542 Cervicalgia: Secondary | ICD-10-CM | POA: Diagnosis not present

## 2014-08-07 DIAGNOSIS — B0229 Other postherpetic nervous system involvement: Secondary | ICD-10-CM | POA: Insufficient documentation

## 2014-08-07 MED ORDER — GABAPENTIN 300 MG PO CAPS: 300.0000 mg | ORAL_CAPSULE | Freq: Three times a day (TID) | ORAL | Status: AC

## 2014-08-07 MED ORDER — LIDOCAINE 5 % EX PTCH: 2.0000 | MEDICATED_PATCH | CUTANEOUS | Status: DC

## 2014-08-07 NOTE — Progress Notes (Signed)
Subjective:    Patient ID: Shelia Davis, female    DOB: [birth year], 63 y.o.   MRN: [id number]  HPI   Shelia Davis is back regarding her cervical myelopathy. She came down with the shingles a couple weeks ago. She is having a lot of pain in her right shoulder or neck as a result. She finds it difficult to wear a bra and it's hard to sleep at night as well.  She is walking with her cane. Her balance is improving. She is more active around the house.   She feels that her mood has generally improved since I last saw her, but she still has her moments.   Pain Inventory Average Pain 3 Pain Right Now 3 My pain is burning  In the last 24 hours, has pain interfered with the following? General activity 3 Relation with others unsure Enjoyment of life 3 What TIME of day is your pain at its worst? night Sleep (in general) Poor  Pain is worse with: walking, bending, standing and some activites Pain improves with: rest, heat/ice, pacing activities and medication Relief from Meds: 8  Mobility walk with assistance use a cane use a walker how many minutes can you walk? 20 ability to climb steps?  yes do you drive?  no  Function not employed: date last employed 08/07/2013 disabled: date disabled 01/18/2014 I need assistance with the following:  meal prep, household duties and shopping  Neuro/Psych numbness trouble walking spasms  Prior Studies Any changes since last visit?  no  Physicians involved in your care Any changes since last visit?  no   History reviewed. No pertinent family history. History   Social History  . Marital Status: Married    Spouse Name: N/A    Number of Children: N/A  . Years of Education: N/A   Social History Main Topics  . Smoking status: Never Smoker   . Smokeless tobacco: None  . Alcohol Use: No  . Drug Use: No  . Sexual Activity: None   Other Topics Concern  . None   Social History Narrative   Past Surgical History  Procedure Laterality  Date  . Cholecystectomy    . Abdominal hysterectomy    . Neck fusion    . Falls      numerous breaks from falls,wrist X3, fingers,lt leg,ankle,foot  . Fracture surgery      freq. falls  . Anterior cervical decomp/discectomy fusion N/A 08/07/2013    Procedure: CERVICAL THREE-FOUR ANTERIOR CERVICAL DECOMPRESSION/DISCECTOMY FUSION 1 LEVEL;  Surgeon: Floyce Stakes, MD;  Location: [facility];  Service: Neurosurgery;  Laterality: N/A;  C3-4 Anterior cervical decompression/diskectomy/fusion   Past Medical History  Diagnosis Date  . Osteoarthritis   . PONV (postoperative nausea and vomiting)    BP 137/70 mmHg  Pulse 88  Resp 14  Ht 5' 5.5" (1.664 m)  Wt 202 lb (91.627 kg)  BMI 33.09 kg/m2  SpO2 97%  Opioid Risk Score:   Fall Risk Score:    Review of Systems  Skin:       Shingles  Neurological: Positive for numbness.       Spasms  All other systems reviewed and are negative.      Objective:   Physical Exam  HEENT: normal   Cardio: RRR and no murmurs  Resp: CTA B/L and unlabored  GI: BS positive and non tender, non distended  Extremity: Pulses positive and No Edema  Skin:  No evidence of drainage  Neuro: Alert/Oriented,  Cranial Nerve II-XII normal, diminsihed pp and lt from shoulders to feet. Left > right shoulders with more substantial loss and dysesthesias. Left shoulder 3, right shoulder 3+, biceps and triceps are 4. Wrist and HI are 4-. Still . LE's are grossly 3 4-. dtr's are hyperactive in all 4. Used cane today and demonstrated excellent balance and weight shift. Right shoulder/neck hypersensitive to touch, mild residual rash along neck. Musc: left knee with recurvatum during gait.  Gen NAD  Psych: affect improved today. No tears.     Assessment/Plan:  1. 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 Pain Management:  -continue with baclofen prn for spasms-  -continue with tramadol prn  3.  Myofascial pain  -posture reviewed again. Needs to work on better shoulder positioning and head position  4. Post-herpetic neuralgia- right C4 level  -increase gabapentin to 300mg  tid  -lidoderm patches 2 per day, #60 5. Follow up with me in about 6 weeks. 30 minutes of face to face patient care time were spent during this visit. All questions were encouraged and answered. She remains totally disabled from vocational activities.

## 2014-08-07 NOTE — Patient Instructions (Signed)
You may wear the lidoderm patch up to 14 hours at a time. You may cut them

## 2014-09-18 ENCOUNTER — Encounter: Payer: BC Managed Care – PPO | Admitting: Physical Medicine & Rehabilitation

## 2014-10-14 ENCOUNTER — Ambulatory Visit: Payer: BC Managed Care – PPO | Admitting: Physical Medicine & Rehabilitation

## 2014-10-30 ENCOUNTER — Other Ambulatory Visit: Payer: Self-pay | Admitting: *Deleted

## 2014-10-30 DIAGNOSIS — M4712 Other spondylosis with myelopathy, cervical region: Secondary | ICD-10-CM

## 2014-10-30 MED ORDER — BACLOFEN 10 MG PO TABS: 10.0000 mg | ORAL_TABLET | Freq: Three times a day (TID) | ORAL | Status: AC | PRN

## 2014-11-11 ENCOUNTER — Encounter: Payer: Self-pay | Admitting: Physical Medicine & Rehabilitation

## 2014-11-11 ENCOUNTER — Encounter
Payer: BLUE CROSS/BLUE SHIELD | Attending: Physical Medicine & Rehabilitation | Admitting: Physical Medicine & Rehabilitation

## 2014-11-11 VITALS — BP 145/78 | HR 70 | Resp 14

## 2014-11-11 DIAGNOSIS — G959 Disease of spinal cord, unspecified: Secondary | ICD-10-CM | POA: Diagnosis not present

## 2014-11-11 DIAGNOSIS — M4712 Other spondylosis with myelopathy, cervical region: Secondary | ICD-10-CM

## 2014-11-11 DIAGNOSIS — R208 Other disturbances of skin sensation: Secondary | ICD-10-CM | POA: Diagnosis not present

## 2014-11-11 DIAGNOSIS — B0229 Other postherpetic nervous system involvement: Secondary | ICD-10-CM

## 2014-11-11 NOTE — Patient Instructions (Signed)
PLEASE CALL ME WITH ANY PROBLEMS OR QUESTIONS (#[phone]).      

## 2014-11-11 NOTE — Progress Notes (Signed)
Subjective:    Patient ID: Shelia Davis, female    DOB: [birth year], 64 y.o.   MRN: [id number]  HPI  Shelia Davis is back regarding her myelopathy. She feels as if she has made further progress. Her PHN has improved with the gabapentin and lidoderm. She is no longer using the lidoderm patches now. She still has some tingling and buring in her feet. She is walking wither cane. Her husband takes her out frequently for walks in the community.   Her mood is better also. She has tried to lose weight and is down about 10lbs by her account.     Pain Inventory Average Pain 3 Pain Right Now 1 My pain is intermittent and throbbing  In the last 24 hours, has pain interfered with the following? General activity 7 Relation with others 4 Enjoyment of life 4 What TIME of day is your pain at its worst? varies with activity Sleep (in general) Poor  Pain is worse with: walking, bending and some activites Pain improves with: medication Relief from Meds: 7  Mobility walk with assistance use a cane use a walker how many minutes can you walk? 10 ability to climb steps?  yes do you drive?  yes  Function disabled: date disabled .  Neuro/Psych No problems in this area  Prior Studies Any changes since last visit?  no  Physicians involved in your care Any changes since last visit?  no   History reviewed. No pertinent family history. History   Social History  . Marital Status: Married    Spouse Name: N/A  . Number of Children: N/A  . Years of Education: N/A   Social History Main Topics  . Smoking status: Never Smoker   . Smokeless tobacco: Not on file  . Alcohol Use: No  . Drug Use: No  . Sexual Activity: Not on file   Other Topics Concern  . None   Social History Narrative   Past Surgical History  Procedure Laterality Date  . Cholecystectomy    . Abdominal hysterectomy    . Neck fusion    . Falls      numerous breaks from falls,wrist X3, fingers,lt leg,ankle,foot  .  Fracture surgery      freq. falls  . Anterior cervical decomp/discectomy fusion N/A 08/07/2013    Procedure: CERVICAL THREE-FOUR ANTERIOR CERVICAL DECOMPRESSION/DISCECTOMY FUSION 1 LEVEL;  Surgeon: Floyce Stakes, MD;  Location: [facility];  Service: Neurosurgery;  Laterality: N/A;  C3-4 Anterior cervical decompression/diskectomy/fusion   Past Medical History  Diagnosis Date  . Osteoarthritis   . PONV (postoperative nausea and vomiting)    BP 145/78 mmHg  Pulse 70  Resp 14  SpO2 98%  Opioid Risk Score:   Fall Risk Score:     Review of Systems  Psychiatric/Behavioral: Positive for dysphoric mood.  All other systems reviewed and are negative.      Objective:   Physical Exam  HEENT: normal  Cardio: RRR and no murmurs  Resp: CTA B/L and unlabored  GI: BS positive and non tender, non distended  Extremity: Pulses positive and No Edema  Skin: No evidence of drainage  Neuro: Alert/Oriented, Cranial Nerve II-XII normal, diminsihed pp and lt from shoulders to feet. Left > right shoulders with more substantial loss and dysesthesias. Left shoulder 3+, right shoulder 3+, biceps and triceps are 4. Wrist and HI are 4-. Still . LE's are grossly 3- to 3/5 HF and KE,KF, 1-2/5 ADF/APF. dtr's are hyperactive in all 4.  Used cane today and demonstrated good balance and weight shift. Right shoulder/neck non-tender.  Musc: she drags the feet and walks with a steppage gait pattern bilaterally. Gen NAD  Psych: affect improved is much improved. She is smiling and joking frequently.    Assessment/Plan:  1. 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 2. Pain Management:  -continue with baclofen prn  for spasms-  -  tramadol prn  3. Myofascial pain  -posture reviewed again. Needs to work on better shoulder positioning and head position  4. Post-herpetic neuralgia- right C4 level---resolved -continue gabapentin to 300mg  tid for dysesthesias in feet if nothing else   5. Follow up with me in about 3 months. 30 minutes of face to face patient care time were spent during this visit. All questions were encouraged and answered. She remains totally disabled from vocational activities.

## 2014-12-09 ENCOUNTER — Telehealth: Payer: Self-pay | Admitting: Physical Medicine & Rehabilitation

## 2014-12-09 NOTE — Telephone Encounter (Signed)
Patient's husband called about a bill that he received and said it was billed wrong.  He stated that his insurance was not going to pay for treatment room.  I told him this is how pit billing has always been done, and he said that patient would not be back, and he is not paying the bill and neither is insurance company.  He then stated that he would get a lawyer to take care of this matter and said that we are ripping them off and would let the news company know about this as well.

## 2015-02-10 ENCOUNTER — Ambulatory Visit: Payer: BLUE CROSS/BLUE SHIELD | Admitting: Physical Medicine & Rehabilitation

## 2016-04-02 DIAGNOSIS — E782 Mixed hyperlipidemia: Secondary | ICD-10-CM | POA: Diagnosis not present

## 2016-04-02 DIAGNOSIS — M8580 Other specified disorders of bone density and structure, unspecified site: Secondary | ICD-10-CM | POA: Diagnosis not present

## 2016-04-02 DIAGNOSIS — M542 Cervicalgia: Secondary | ICD-10-CM | POA: Diagnosis not present

## 2016-04-02 DIAGNOSIS — J0111 Acute recurrent frontal sinusitis: Secondary | ICD-10-CM | POA: Diagnosis not present

## 2016-04-02 DIAGNOSIS — I10 Essential (primary) hypertension: Secondary | ICD-10-CM | POA: Diagnosis not present

## 2016-04-02 DIAGNOSIS — E559 Vitamin D deficiency, unspecified: Secondary | ICD-10-CM | POA: Diagnosis not present

## 2016-05-06 DIAGNOSIS — E782 Mixed hyperlipidemia: Secondary | ICD-10-CM | POA: Diagnosis not present

## 2016-10-05 DIAGNOSIS — E559 Vitamin D deficiency, unspecified: Secondary | ICD-10-CM | POA: Diagnosis not present

## 2016-10-05 DIAGNOSIS — M8580 Other specified disorders of bone density and structure, unspecified site: Secondary | ICD-10-CM | POA: Diagnosis not present

## 2016-10-05 DIAGNOSIS — I10 Essential (primary) hypertension: Secondary | ICD-10-CM | POA: Diagnosis not present

## 2016-10-05 DIAGNOSIS — M542 Cervicalgia: Secondary | ICD-10-CM | POA: Diagnosis not present

## 2016-10-05 DIAGNOSIS — R7309 Other abnormal glucose: Secondary | ICD-10-CM | POA: Diagnosis not present

## 2016-10-05 DIAGNOSIS — E782 Mixed hyperlipidemia: Secondary | ICD-10-CM | POA: Diagnosis not present

## 2016-10-05 DIAGNOSIS — J0101 Acute recurrent maxillary sinusitis: Secondary | ICD-10-CM | POA: Diagnosis not present

## 2017-05-10 DIAGNOSIS — E559 Vitamin D deficiency, unspecified: Secondary | ICD-10-CM | POA: Diagnosis not present

## 2017-05-10 DIAGNOSIS — Z6835 Body mass index (BMI) 35.0-35.9, adult: Secondary | ICD-10-CM | POA: Diagnosis not present

## 2017-05-10 DIAGNOSIS — I10 Essential (primary) hypertension: Secondary | ICD-10-CM | POA: Diagnosis not present

## 2017-05-10 DIAGNOSIS — M542 Cervicalgia: Secondary | ICD-10-CM | POA: Diagnosis not present

## 2017-05-10 DIAGNOSIS — R7303 Prediabetes: Secondary | ICD-10-CM | POA: Diagnosis not present

## 2017-05-10 DIAGNOSIS — M85851 Other specified disorders of bone density and structure, right thigh: Secondary | ICD-10-CM | POA: Diagnosis not present

## 2017-05-10 DIAGNOSIS — E782 Mixed hyperlipidemia: Secondary | ICD-10-CM | POA: Diagnosis not present

## 2017-05-10 DIAGNOSIS — Z23 Encounter for immunization: Secondary | ICD-10-CM | POA: Diagnosis not present

## 2017-06-20 DIAGNOSIS — M8588 Other specified disorders of bone density and structure, other site: Secondary | ICD-10-CM | POA: Diagnosis not present

## 2017-09-08 DIAGNOSIS — R69 Illness, unspecified: Secondary | ICD-10-CM | POA: Diagnosis not present

## 2017-11-10 DIAGNOSIS — E782 Mixed hyperlipidemia: Secondary | ICD-10-CM | POA: Diagnosis not present

## 2017-11-10 DIAGNOSIS — Z6835 Body mass index (BMI) 35.0-35.9, adult: Secondary | ICD-10-CM | POA: Diagnosis not present

## 2017-11-10 DIAGNOSIS — J3489 Other specified disorders of nose and nasal sinuses: Secondary | ICD-10-CM | POA: Diagnosis not present

## 2017-11-10 DIAGNOSIS — I10 Essential (primary) hypertension: Secondary | ICD-10-CM | POA: Diagnosis not present

## 2017-11-10 DIAGNOSIS — E559 Vitamin D deficiency, unspecified: Secondary | ICD-10-CM | POA: Diagnosis not present

## 2017-11-10 DIAGNOSIS — M542 Cervicalgia: Secondary | ICD-10-CM | POA: Diagnosis not present

## 2017-11-10 DIAGNOSIS — M85851 Other specified disorders of bone density and structure, right thigh: Secondary | ICD-10-CM | POA: Diagnosis not present

## 2017-11-10 DIAGNOSIS — R7303 Prediabetes: Secondary | ICD-10-CM | POA: Diagnosis not present

## 2018-06-08 DIAGNOSIS — R7303 Prediabetes: Secondary | ICD-10-CM | POA: Diagnosis not present

## 2018-06-08 DIAGNOSIS — M542 Cervicalgia: Secondary | ICD-10-CM | POA: Diagnosis not present

## 2018-06-08 DIAGNOSIS — M85851 Other specified disorders of bone density and structure, right thigh: Secondary | ICD-10-CM | POA: Diagnosis not present

## 2018-06-08 DIAGNOSIS — I10 Essential (primary) hypertension: Secondary | ICD-10-CM | POA: Diagnosis not present

## 2018-06-08 DIAGNOSIS — E782 Mixed hyperlipidemia: Secondary | ICD-10-CM | POA: Diagnosis not present

## 2018-06-08 DIAGNOSIS — J0100 Acute maxillary sinusitis, unspecified: Secondary | ICD-10-CM | POA: Diagnosis not present

## 2018-06-08 DIAGNOSIS — E559 Vitamin D deficiency, unspecified: Secondary | ICD-10-CM | POA: Diagnosis not present

## 2018-06-08 DIAGNOSIS — Z1239 Encounter for other screening for malignant neoplasm of breast: Secondary | ICD-10-CM | POA: Diagnosis not present

## 2018-06-08 DIAGNOSIS — Z1211 Encounter for screening for malignant neoplasm of colon: Secondary | ICD-10-CM | POA: Diagnosis not present

## 2018-06-19 ENCOUNTER — Other Ambulatory Visit: Payer: Self-pay | Admitting: Family Medicine

## 2018-06-19 DIAGNOSIS — Z1239 Encounter for other screening for malignant neoplasm of breast: Secondary | ICD-10-CM

## 2018-07-07 DIAGNOSIS — Z23 Encounter for immunization: Secondary | ICD-10-CM | POA: Diagnosis not present

## 2018-09-04 DIAGNOSIS — D230 Other benign neoplasm of skin of lip: Secondary | ICD-10-CM | POA: Diagnosis not present

## 2018-09-04 DIAGNOSIS — L570 Actinic keratosis: Secondary | ICD-10-CM | POA: Diagnosis not present

## 2018-09-04 DIAGNOSIS — X32XXXA Exposure to sunlight, initial encounter: Secondary | ICD-10-CM | POA: Diagnosis not present

## 2018-12-07 DIAGNOSIS — R7303 Prediabetes: Secondary | ICD-10-CM | POA: Diagnosis not present

## 2018-12-07 DIAGNOSIS — B356 Tinea cruris: Secondary | ICD-10-CM | POA: Diagnosis not present

## 2018-12-07 DIAGNOSIS — M85851 Other specified disorders of bone density and structure, right thigh: Secondary | ICD-10-CM | POA: Diagnosis not present

## 2018-12-07 DIAGNOSIS — E559 Vitamin D deficiency, unspecified: Secondary | ICD-10-CM | POA: Diagnosis not present

## 2018-12-07 DIAGNOSIS — Z1211 Encounter for screening for malignant neoplasm of colon: Secondary | ICD-10-CM | POA: Diagnosis not present

## 2018-12-07 DIAGNOSIS — M542 Cervicalgia: Secondary | ICD-10-CM | POA: Diagnosis not present

## 2018-12-07 DIAGNOSIS — E782 Mixed hyperlipidemia: Secondary | ICD-10-CM | POA: Diagnosis not present

## 2018-12-07 DIAGNOSIS — I10 Essential (primary) hypertension: Secondary | ICD-10-CM | POA: Diagnosis not present

## 2019-01-30 DIAGNOSIS — Z1211 Encounter for screening for malignant neoplasm of colon: Secondary | ICD-10-CM | POA: Diagnosis not present

## 2019-06-12 DIAGNOSIS — M542 Cervicalgia: Secondary | ICD-10-CM | POA: Diagnosis not present

## 2019-06-12 DIAGNOSIS — M85851 Other specified disorders of bone density and structure, right thigh: Secondary | ICD-10-CM | POA: Diagnosis not present

## 2019-06-12 DIAGNOSIS — R7303 Prediabetes: Secondary | ICD-10-CM | POA: Diagnosis not present

## 2019-06-12 DIAGNOSIS — E559 Vitamin D deficiency, unspecified: Secondary | ICD-10-CM | POA: Diagnosis not present

## 2019-06-12 DIAGNOSIS — E782 Mixed hyperlipidemia: Secondary | ICD-10-CM | POA: Diagnosis not present

## 2019-06-12 DIAGNOSIS — I10 Essential (primary) hypertension: Secondary | ICD-10-CM | POA: Diagnosis not present

## 2019-07-03 DIAGNOSIS — R7303 Prediabetes: Secondary | ICD-10-CM | POA: Diagnosis not present

## 2019-07-03 DIAGNOSIS — E782 Mixed hyperlipidemia: Secondary | ICD-10-CM | POA: Diagnosis not present

## 2019-07-03 DIAGNOSIS — E559 Vitamin D deficiency, unspecified: Secondary | ICD-10-CM | POA: Diagnosis not present

## 2019-07-03 DIAGNOSIS — Z23 Encounter for immunization: Secondary | ICD-10-CM | POA: Diagnosis not present

## 2019-07-19 DIAGNOSIS — J019 Acute sinusitis, unspecified: Secondary | ICD-10-CM | POA: Diagnosis not present

## 2020-06-27 DIAGNOSIS — Z23 Encounter for immunization: Secondary | ICD-10-CM | POA: Diagnosis not present

## 2020-06-27 DIAGNOSIS — M542 Cervicalgia: Secondary | ICD-10-CM | POA: Diagnosis not present

## 2020-06-27 DIAGNOSIS — J302 Other seasonal allergic rhinitis: Secondary | ICD-10-CM | POA: Diagnosis not present

## 2020-06-27 DIAGNOSIS — E559 Vitamin D deficiency, unspecified: Secondary | ICD-10-CM | POA: Diagnosis not present

## 2020-06-27 DIAGNOSIS — E782 Mixed hyperlipidemia: Secondary | ICD-10-CM | POA: Diagnosis not present

## 2020-06-27 DIAGNOSIS — M85851 Other specified disorders of bone density and structure, right thigh: Secondary | ICD-10-CM | POA: Diagnosis not present

## 2020-06-27 DIAGNOSIS — R7303 Prediabetes: Secondary | ICD-10-CM | POA: Diagnosis not present

## 2020-06-27 DIAGNOSIS — I10 Essential (primary) hypertension: Secondary | ICD-10-CM | POA: Diagnosis not present

## 2020-07-30 ENCOUNTER — Other Ambulatory Visit: Payer: Self-pay | Admitting: Family Medicine

## 2020-07-30 DIAGNOSIS — M8580 Other specified disorders of bone density and structure, unspecified site: Secondary | ICD-10-CM

## 2020-09-18 DIAGNOSIS — M8580 Other specified disorders of bone density and structure, unspecified site: Secondary | ICD-10-CM | POA: Diagnosis not present

## 2020-09-18 DIAGNOSIS — E782 Mixed hyperlipidemia: Secondary | ICD-10-CM | POA: Diagnosis not present

## 2020-09-18 DIAGNOSIS — I10 Essential (primary) hypertension: Secondary | ICD-10-CM | POA: Diagnosis not present

## 2021-06-04 DIAGNOSIS — Z23 Encounter for immunization: Secondary | ICD-10-CM | POA: Diagnosis not present

## 2022-01-15 DIAGNOSIS — C44729 Squamous cell carcinoma of skin of left lower limb, including hip: Secondary | ICD-10-CM | POA: Diagnosis not present

## 2022-03-12 DIAGNOSIS — Z08 Encounter for follow-up examination after completed treatment for malignant neoplasm: Secondary | ICD-10-CM | POA: Diagnosis not present

## 2022-03-12 DIAGNOSIS — Z85828 Personal history of other malignant neoplasm of skin: Secondary | ICD-10-CM | POA: Diagnosis not present

## 2022-06-04 DIAGNOSIS — R052 Subacute cough: Secondary | ICD-10-CM | POA: Diagnosis not present

## 2022-06-11 ENCOUNTER — Ambulatory Visit
Admission: RE | Admit: 2022-06-11 | Discharge: 2022-06-11 | Disposition: A | Discharge status: Home / Self Care | Payer: Medicare HMO | Source: Ambulatory Visit | Attending: Family Medicine | Admitting: Family Medicine

## 2022-06-11 ENCOUNTER — Other Ambulatory Visit: Payer: Self-pay | Admitting: Family Medicine

## 2022-06-11 DIAGNOSIS — I10 Essential (primary) hypertension: Secondary | ICD-10-CM | POA: Diagnosis not present

## 2022-06-11 DIAGNOSIS — R059 Cough, unspecified: Secondary | ICD-10-CM

## 2022-06-11 DIAGNOSIS — E559 Vitamin D deficiency, unspecified: Secondary | ICD-10-CM | POA: Diagnosis not present

## 2022-06-11 DIAGNOSIS — M542 Cervicalgia: Secondary | ICD-10-CM | POA: Diagnosis not present

## 2022-06-11 DIAGNOSIS — E782 Mixed hyperlipidemia: Secondary | ICD-10-CM | POA: Diagnosis not present

## 2022-06-11 DIAGNOSIS — J302 Other seasonal allergic rhinitis: Secondary | ICD-10-CM | POA: Diagnosis not present

## 2022-06-11 DIAGNOSIS — R7303 Prediabetes: Secondary | ICD-10-CM | POA: Diagnosis not present

## 2022-06-11 DIAGNOSIS — M85851 Other specified disorders of bone density and structure, right thigh: Secondary | ICD-10-CM | POA: Diagnosis not present

## 2022-07-30 DIAGNOSIS — Z23 Encounter for immunization: Secondary | ICD-10-CM | POA: Diagnosis not present

## 2023-02-25 DIAGNOSIS — H5213 Myopia, bilateral: Secondary | ICD-10-CM | POA: Diagnosis not present

## 2023-02-25 DIAGNOSIS — H524 Presbyopia: Secondary | ICD-10-CM | POA: Diagnosis not present

## 2023-02-25 DIAGNOSIS — H52209 Unspecified astigmatism, unspecified eye: Secondary | ICD-10-CM | POA: Diagnosis not present

## 2023-03-25 ENCOUNTER — Other Ambulatory Visit: Payer: Self-pay

## 2023-03-25 ENCOUNTER — Encounter (HOSPITAL_BASED_OUTPATIENT_CLINIC_OR_DEPARTMENT_OTHER): Payer: Self-pay

## 2023-03-25 ENCOUNTER — Emergency Department (HOSPITAL_BASED_OUTPATIENT_CLINIC_OR_DEPARTMENT_OTHER): Payer: Medicare HMO

## 2023-03-25 ENCOUNTER — Emergency Department (HOSPITAL_BASED_OUTPATIENT_CLINIC_OR_DEPARTMENT_OTHER)
Admission: EM | Admit: 2023-03-25 | Discharge: 2023-03-25 | Disposition: A | Discharge status: Home / Self Care | Payer: Medicare HMO | Attending: Emergency Medicine | Admitting: Emergency Medicine

## 2023-03-25 DIAGNOSIS — K769 Liver disease, unspecified: Secondary | ICD-10-CM | POA: Diagnosis not present

## 2023-03-25 DIAGNOSIS — E876 Hypokalemia: Secondary | ICD-10-CM

## 2023-03-25 DIAGNOSIS — R1032 Left lower quadrant pain: Secondary | ICD-10-CM | POA: Diagnosis not present

## 2023-03-25 DIAGNOSIS — B029 Zoster without complications: Secondary | ICD-10-CM

## 2023-03-25 DIAGNOSIS — M549 Dorsalgia, unspecified: Secondary | ICD-10-CM | POA: Diagnosis not present

## 2023-03-25 DIAGNOSIS — R109 Unspecified abdominal pain: Secondary | ICD-10-CM | POA: Diagnosis present

## 2023-03-25 LAB — CBC WITH DIFFERENTIAL/PLATELET
Abs Immature Granulocytes: 0.03 10*3/uL (ref 0.00–0.07)
Basophils Absolute: 0.1 10*3/uL (ref 0.0–0.1)
Basophils Relative: 1 %
Eosinophils Absolute: 0.2 10*3/uL (ref 0.0–0.5)
Eosinophils Relative: 2 %
HCT: 40.6 % (ref 36.0–46.0)
Hemoglobin: 13.4 g/dL (ref 12.0–15.0)
Immature Granulocytes: 0 %
Lymphocytes Relative: 33 %
Lymphs Abs: 3.2 10*3/uL (ref 0.7–4.0)
MCH: 29.3 pg (ref 26.0–34.0)
MCHC: 33 g/dL (ref 30.0–36.0)
MCV: 88.8 fL (ref 80.0–100.0)
Monocytes Absolute: 0.7 10*3/uL (ref 0.1–1.0)
Monocytes Relative: 7 %
Neutro Abs: 5.5 10*3/uL (ref 1.7–7.7)
Neutrophils Relative %: 57 %
Platelets: 244 10*3/uL (ref 150–400)
RBC: 4.57 MIL/uL (ref 3.87–5.11)
RDW: 14.5 % (ref 11.5–15.5)
WBC: 9.6 10*3/uL (ref 4.0–10.5)
nRBC: 0 % (ref 0.0–0.2)

## 2023-03-25 LAB — COMPREHENSIVE METABOLIC PANEL
ALT: 14 U/L (ref 0–44)
AST: 17 U/L (ref 15–41)
Albumin: 3.4 g/dL — ABNORMAL LOW (ref 3.5–5.0)
Alkaline Phosphatase: 58 U/L (ref 38–126)
Anion gap: 10 (ref 5–15)
BUN: 18 mg/dL (ref 8–23)
CO2: 25 mmol/L (ref 22–32)
Calcium: 9 mg/dL (ref 8.9–10.3)
Chloride: 103 mmol/L (ref 98–111)
Creatinine, Ser: 0.74 mg/dL (ref 0.44–1.00)
GFR, Estimated: >60 mL/min (ref >60)
Glucose, Bld: 139 mg/dL — ABNORMAL HIGH (ref 70–99)
Potassium: 3.1 mmol/L — ABNORMAL LOW (ref 3.5–5.1)
Sodium: 138 mmol/L (ref 135–145)
Total Bilirubin: 0.4 mg/dL (ref 0.3–1.2)
Total Protein: 6.8 g/dL (ref 6.5–8.1)

## 2023-03-25 LAB — URINALYSIS, ROUTINE W REFLEX MICROSCOPIC
Bilirubin Urine: NEGATIVE
Glucose, UA: NEGATIVE mg/dL
Hgb urine dipstick: NEGATIVE
Ketones, ur: NEGATIVE mg/dL
Nitrite: POSITIVE — AB
Protein, ur: NEGATIVE mg/dL
Specific Gravity, Urine: >=1.03 (ref 1.005–1.030)
pH: 5.5 (ref 5.0–8.0)

## 2023-03-25 LAB — URINALYSIS, MICROSCOPIC (REFLEX): RBC / HPF: NONE SEEN RBC/hpf (ref 0–5)

## 2023-03-25 LAB — LIPASE, BLOOD: Lipase: 32 U/L (ref 11–51)

## 2023-03-25 MED ORDER — SODIUM CHLORIDE 0.9 % IV SOLN: INTRAVENOUS | Status: DC

## 2023-03-25 MED ORDER — POTASSIUM CHLORIDE CRYS ER 20 MEQ PO TBCR: 20.0000 meq | EXTENDED_RELEASE_TABLET | Freq: Two times a day (BID) | ORAL | 0 refills | Status: AC

## 2023-03-25 MED ORDER — VALACYCLOVIR HCL 1 G PO TABS: 1000.0000 mg | ORAL_TABLET | Freq: Three times a day (TID) | ORAL | 0 refills | Status: AC

## 2023-03-25 MED ORDER — ONDANSETRON HCL 4 MG/2ML IJ SOLN
4.0000 mg | Freq: Once | INTRAMUSCULAR | Status: AC
  Administered 2023-03-25: 4 mg via INTRAVENOUS
  Filled 2023-03-25: qty 2

## 2023-03-25 MED ORDER — HYDROMORPHONE HCL 1 MG/ML IJ SOLN
1.0000 mg | Freq: Once | INTRAMUSCULAR | Status: AC
  Administered 2023-03-25: 1 mg via INTRAVENOUS
  Filled 2023-03-25: qty 1

## 2023-03-25 MED ORDER — IOHEXOL 300 MG/ML  SOLN
100.0000 mL | Freq: Once | INTRAMUSCULAR | Status: AC | PRN
  Administered 2023-03-25: 100 mL via INTRAVENOUS

## 2023-03-25 MED ORDER — SODIUM CHLORIDE 0.9 % IV BOLUS
250.0000 mL | Freq: Once | INTRAVENOUS | Status: AC
  Administered 2023-03-25: 250 mL via INTRAVENOUS

## 2023-03-25 MED ORDER — HYDROMORPHONE HCL 1 MG/ML IJ SOLN
0.5000 mg | Freq: Once | INTRAMUSCULAR | Status: AC
  Administered 2023-03-25: 0.5 mg via INTRAVENOUS
  Filled 2023-03-25: qty 1

## 2023-03-25 MED ORDER — HYDROCODONE-ACETAMINOPHEN 5-325 MG PO TABS: 1.0000 | ORAL_TABLET | Freq: Four times a day (QID) | ORAL | 0 refills | Status: AC | PRN

## 2023-03-25 MED ORDER — ONDANSETRON 4 MG PO TBDP: 4.0000 mg | ORAL_TABLET | Freq: Three times a day (TID) | ORAL | 1 refills | Status: AC | PRN

## 2023-03-25 NOTE — ED Provider Notes (Addendum)
[address] [facility] Provider Note   CSN: [id number] Arrival date & time: 03/25/23  1858     History  Chief Complaint  Patient presents with   Abdominal Pain    Shelia Davis is a 72 y.o. female.  Patient with complaint of left-sided abdominal pain that actually moves around to the back and a little bit above the rib margin.  Pains been very severe cannot get comfortable.  No pain on the right side is all and does not like to have the skin touched it aggravates it to have the skin touch.  Associated with some nausea not eating or drinking very well.  Past medical history sniffing for osteoarthritis.  Past surgical history of a cholecystectomy and abdominal hysterectomy.  Patient's never used tobacco products.       Home Medications Prior to Admission medications   Medication Sig Start Date End Date Taking? Authorizing Provider  HYDROcodone-acetaminophen (NORCO/VICODIN) 5-325 MG tablet Take 1 tablet by mouth every 6 (six) hours as needed for moderate pain. 03/25/23  Yes Vanetta Mulders, MD  ondansetron (ZOFRAN-ODT) 4 MG disintegrating tablet Take 1 tablet (4 mg total) by mouth every 8 (eight) hours as needed. 03/25/23  Yes Vanetta Mulders, MD  potassium chloride SA (KLOR-CON M) 20 MEQ tablet Take 1 tablet (20 mEq total) by mouth 2 (two) times daily. 03/25/23  Yes Vanetta Mulders, MD  valACYclovir (VALTREX) 1000 MG tablet Take 1 tablet (1,000 mg total) by mouth 3 (three) times daily. 03/25/23  Yes Vanetta Mulders, MD  acetaminophen (TYLENOL) 325 MG tablet Take 2 tablets (650 mg total) by mouth every 6 (six) hours as needed for mild pain. 08/23/13   Angiulli, Mcarthur Rossetti, PA-C  alendronate (FOSAMAX) 70 MG tablet  11/05/13   [provider]  ALPRAZolam Prudy Feeler) 0.25 MG tablet  11/08/13   [provider]  baclofen (LIORESAL) 10 MG tablet Take 1 tablet (10 mg total) by mouth every 8 (eight) hours as needed for muscle spasms. 90 day Refill 10/30/14    Ranelle Oyster, MD  ergocalciferol (VITAMIN D2) 50000 UNITS capsule Take 50,000 Units by mouth once a week.    [provider]  gabapentin (NEURONTIN) 300 MG capsule Take 1 capsule (300 mg total) by mouth 3 (three) times daily. 08/07/14   Ranelle Oyster, MD  loratadine (CLARITIN) 10 MG tablet Take 10 mg by mouth daily.    [provider]  LORazepam (ATIVAN) 0.5 MG tablet  10/03/13   [provider]  losartan (COZAAR) 100 MG tablet  01/25/14   [provider]  meloxicam (MOBIC) 7.5 MG tablet Take 1 tablet (7.5 mg total) by mouth daily. 10/02/13   Ranelle Oyster, MD  senna (SENOKOT) 8.6 MG TABS tablet Take 1 tablet (8.6 mg total) by mouth 2 (two) times daily. 08/13/13   Hilda Lias, MD  traMADol (ULTRAM) 50 MG tablet Take 1 tablet (50 mg total) by mouth every 6 (six) hours as needed for moderate pain or severe pain. 02/04/14   Ranelle Oyster, MD      Allergies    Levaquin [levofloxacin] and Phenergan [promethazine hcl]    Review of Systems   Review of Systems  Constitutional:  Negative for chills and fever.  HENT:  Negative for ear pain and sore throat.   Eyes:  Negative for pain and visual disturbance.  Respiratory:  Negative for cough and shortness of breath.   Cardiovascular:  Negative for chest pain and palpitations.  Gastrointestinal:  Positive for abdominal pain and nausea. Negative for vomiting.  Genitourinary:  Negative for dysuria and hematuria.  Musculoskeletal:  Positive for back pain. Negative for arthralgias.  Skin:  Negative for color change and rash.  Neurological:  Negative for seizures and syncope.  All other systems reviewed and are negative.   Physical Exam Updated Vital Signs BP 138/60 (BP Location: Right Arm)   Pulse 70   Temp 98 F (36.7 C) (Oral)   Resp 18   Wt 91.6 kg   SpO2 98%   BMI 33.10 kg/m  Physical Exam Vitals and nursing note reviewed.  Constitutional:      General: She is not in acute  distress.    Appearance: Normal appearance. She is well-developed.  HENT:     Head: Normocephalic and atraumatic.     Mouth/Throat:     Mouth: Mucous membranes are dry.  Eyes:     Extraocular Movements: Extraocular movements intact.     Conjunctiva/sclera: Conjunctivae normal.     Pupils: Pupils are equal, round, and reactive to light.  Cardiovascular:     Rate and Rhythm: Normal rate and regular rhythm.     Heart sounds: No murmur heard. Pulmonary:     Effort: Pulmonary effort is normal. No respiratory distress.     Breath sounds: Normal breath sounds.  Abdominal:     Palpations: Abdomen is soft.     Tenderness: There is abdominal tenderness. There is no guarding.     Comments: Tenderness to palpation to more left upper quadrant part of the abdomen no erythema no rash.  But does move around to the back where it hurts there are 3 papules developing.  No vesicles.  Musculoskeletal:        General: Tenderness present. No swelling.     Cervical back: Normal range of motion and neck supple.     Comments: Tenderness to palpation along the back around the rib margin area.  And a little below the rib margin there is 3 papules that are erythematous.  No vesicles.  Skin:    General: Skin is warm and dry.     Capillary Refill: Capillary refill takes less than 2 seconds.  Neurological:     General: No focal deficit present.     Mental Status: She is alert and oriented to person, place, and time.  Psychiatric:        Mood and Affect: Mood normal.     ED Results / Procedures / Treatments   Labs (all labs ordered are listed, but only abnormal results are displayed) Labs Reviewed  COMPREHENSIVE METABOLIC PANEL - Abnormal; Notable for the following components:      Result Value   Potassium 3.1 (*)    Glucose, Bld 139 (*)    Albumin 3.4 (*)    All other components within normal limits  URINALYSIS, ROUTINE W REFLEX MICROSCOPIC - Abnormal; Notable for the following components:   Nitrite  POSITIVE (*)    Leukocytes,Ua SMALL (*)    All other components within normal limits  URINALYSIS, MICROSCOPIC (REFLEX) - Abnormal; Notable for the following components:   Bacteria, UA MANY (*)    All other components within normal limits  URINE CULTURE  LIPASE, BLOOD  CBC WITH DIFFERENTIAL/PLATELET    EKG None  Radiology CT ABDOMEN PELVIS W CONTRAST  Result Date: 03/25/2023 CLINICAL DATA:  Left lower quadrant pain EXAM: CT ABDOMEN AND PELVIS WITH CONTRAST TECHNIQUE: Multidetector CT imaging of the abdomen and pelvis was performed  using the standard protocol following bolus administration of intravenous contrast. RADIATION DOSE REDUCTION: This exam was performed according to the departmental dose-optimization program which includes automated exposure control, adjustment of the mA and/or kV according to patient size and/or use of iterative reconstruction technique. CONTRAST:  OMNIPAQUE IOHEXOL 300 MG/ML  SOLN COMPARISON:  None Available. FINDINGS: Lower chest: No acute abnormality Hepatobiliary: Prior cholecystectomy. Mild intrahepatic biliary ductal dilatation likely related to post cholecystectomy state. 3.6 cm low-density lesion in the inferior aspect of the right hepatic lobe with peripheral puddling of contrast most compatible with hemangioma. Pancreas: No focal abnormality or ductal dilatation. Spleen: No focal abnormality.  Normal size. Adrenals/Urinary Tract: No adrenal abnormality. No focal renal abnormality. No stones or hydronephrosis. Urinary bladder is unremarkable. Stomach/Bowel: Stomach, large and small bowel grossly unremarkable. Vascular/Lymphatic: Aortic atherosclerosis. No evidence of aneurysm or adenopathy. Reproductive: Prior hysterectomy.  No adnexal masses. Other: No free fluid or free air. Musculoskeletal: No acute bony abnormality. IMPRESSION: No acute findings in the abdomen or pelvis. Electronically Signed   By: Charlett Nose M.D.   On: 03/25/2023 21:26   DG Chest Port  1 View  Result Date: 03/25/2023 CLINICAL DATA:  Back pain EXAM: PORTABLE CHEST 1 VIEW COMPARISON:  Chest x-ray with ribs 05/25/2013 FINDINGS: The heart size and mediastinal contours are within normal limits. Both lungs are clear. The visualized skeletal structures are unremarkable. IMPRESSION: No active disease. Electronically Signed   By: Darliss Cheney M.D.   On: 03/25/2023 20:30    Procedures Procedures    Medications Ordered in ED Medications  0.9 %  sodium chloride infusion ( Intravenous New Bag/Given 03/25/23 2033)  HYDROmorphone (DILAUDID) injection 0.5 mg (has no administration in time range)  ondansetron [facility]) injection 4 mg (4 mg Intravenous Given 03/25/23 2013)  sodium chloride 0.9 % bolus 250 mL ( Intravenous Stopped 03/25/23 2050)  HYDROmorphone (DILAUDID) injection 1 mg (1 mg Intravenous Given 03/25/23 2014)  iohexol (OMNIPAQUE) 300 MG/ML solution 100 mL (100 mLs Intravenous Contrast Given 03/25/23 2110)    ED Course/ Medical Decision Making/ A&P                             Medical Decision Making Amount and/or Complexity of Data Reviewed Labs: ordered. Radiology: ordered.  Risk Prescription drug management.  Suspected clinically that this was shingles and now there is evidence of 3 papules that are probably the breaking of the rash.  Family and patient are very concerned could be something else so we will CT the abdomen and get a chest x-ray and do basic labs.  But that is most likely the cause.  Will IV hydrate her little bit since her p.o. intake is been poor and her mucous membranes look dry and will also give her antinausea medicine and pain medicine.  Most likely this is shingles outbreak and will treat her with valacyclovir.  And something for pain.  Patient's workup urinalysis questionable urinary tract infection with many bacteria.  But white blood cell was only 6-10.  Will see what urine culture shows.  CT without any acute findings.  Complete metabolic panel just  potassium 3.1 otherwise normal lipase normal CBC no leukocytosis hemoglobin 13.4 and platelets 240 4K.  Chest x-ray also negative.  Potassium was a little low oral potassium supplement provided to take at home.  Patient will get a dose of some IV hydromorphone here before she goes home.  Prescription for hydrocodone and prescription  for Zofran ODT.  And a prescription for valacyclovir for the shingles.  Clinically always suspected that this was probably due to shingles.  But family was very concerned about the abdomen so additional workup was done for them.  Final Clinical Impression(s) / ED Diagnoses Final diagnoses:  Herpes zoster without complication  Hypokalemia    Rx / DC Orders ED Discharge Orders          Ordered    valACYclovir (VALTREX) 1000 MG tablet  3 times daily        03/25/23 2151    potassium chloride SA (KLOR-CON M) 20 MEQ tablet  2 times daily        03/25/23 2151    ondansetron (ZOFRAN-ODT) 4 MG disintegrating tablet  Every 8 hours PRN        03/25/23 2151    HYDROcodone-acetaminophen (NORCO/VICODIN) 5-325 MG tablet  Every 6 hours PRN        03/25/23 2151              Vanetta Mulders, MD 03/25/23 2005    Vanetta Mulders, MD 03/25/23 [id number]    Vanetta Mulders, MD 03/25/23 2154

## 2023-03-25 NOTE — ED Triage Notes (Addendum)
Pt arrives with c/o ABD pain that started about a week ago. Per pt, pain radiates into her back. Pt endorse nausea. Pt denies diarrhea or fever. Per pt, she does have burning with urination

## 2023-03-25 NOTE — Discharge Instructions (Addendum)
Take the valacyclovir as directed.  Start taking it tonight.  Give Zofran as needed for nausea and vomiting.  You have hydrocodone for pain.  Potassium was a little low so take the potassium supplement as directed you can start that tomorrow.  Return for any new or worse symptoms.  Make an appointment to follow-up with your primary care doctor.

## 2023-03-25 NOTE — ED Notes (Signed)
Patient transported to CT 

## 2023-03-27 LAB — URINE CULTURE: Culture: >=100000 — AB

## 2023-03-29 ENCOUNTER — Telehealth (HOSPITAL_BASED_OUTPATIENT_CLINIC_OR_DEPARTMENT_OTHER): Payer: Self-pay | Admitting: *Deleted

## 2023-03-29 NOTE — Telephone Encounter (Signed)
Post ED Visit - Positive Culture Follow-up  Culture report reviewed by antimicrobial stewardship pharmacist: [facility] [x]  [place], [place].D. []  Celedonio Miyamoto, [address].D., BCPS AQ-ID []  Garvin Fila, Pharm.D., BCPS []  Georgina Pillion, [address].D., BCPS []  [place], [address].D., BCPS, AAHIVP []  Estella Husk, Pharm.D., BCPS, AAHIVP []  Lysle Pearl, PharmD, BCPS []  Phillips Climes, PharmD, BCPS []  Agapito Games, PharmD, BCPS []  Verlan Friends, PharmD []  Mervyn Gay, PharmD, BCPS []  Vinnie Level, PharmD  [facility] []  Len Childs, PharmD []  Greer Pickerel, PharmD []  Adalberto Cole, PharmD []  Perlie Gold, Rph []  Lonell Face) Jean Rosenthal, PharmD []  Earl Many, PharmD []  Junita Push, PharmD []  Dorna Leitz, PharmD []  Terrilee Files, PharmD []  Lynann Beaver, PharmD []  Keturah Barre, PharmD []  Loralee Pacas, PharmD []  Bernadene Person, PharmD   Positive urine culture No further patient follow-up is required at this time.  Virl Axe [facility] 03/29/2023, 11:58 AM

## 2023-03-30 DIAGNOSIS — N390 Urinary tract infection, site not specified: Secondary | ICD-10-CM | POA: Diagnosis not present

## 2023-03-30 DIAGNOSIS — B029 Zoster without complications: Secondary | ICD-10-CM | POA: Diagnosis not present

## 2023-03-30 DIAGNOSIS — E876 Hypokalemia: Secondary | ICD-10-CM | POA: Diagnosis not present

## 2023-04-14 DIAGNOSIS — Z87440 Personal history of urinary (tract) infections: Secondary | ICD-10-CM | POA: Diagnosis not present

## 2023-04-14 DIAGNOSIS — R8290 Unspecified abnormal findings in urine: Secondary | ICD-10-CM | POA: Diagnosis not present

## 2023-04-14 DIAGNOSIS — B0229 Other postherpetic nervous system involvement: Secondary | ICD-10-CM | POA: Diagnosis not present

## 2023-12-16 DIAGNOSIS — E559 Vitamin D deficiency, unspecified: Secondary | ICD-10-CM | POA: Diagnosis not present

## 2023-12-16 DIAGNOSIS — R7303 Prediabetes: Secondary | ICD-10-CM | POA: Diagnosis not present

## 2023-12-16 DIAGNOSIS — I10 Essential (primary) hypertension: Secondary | ICD-10-CM | POA: Diagnosis not present

## 2023-12-16 DIAGNOSIS — M542 Cervicalgia: Secondary | ICD-10-CM | POA: Diagnosis not present

## 2023-12-16 DIAGNOSIS — J302 Other seasonal allergic rhinitis: Secondary | ICD-10-CM | POA: Diagnosis not present

## 2023-12-16 DIAGNOSIS — B356 Tinea cruris: Secondary | ICD-10-CM | POA: Diagnosis not present

## 2023-12-16 DIAGNOSIS — M8580 Other specified disorders of bone density and structure, unspecified site: Secondary | ICD-10-CM | POA: Diagnosis not present

## 2023-12-16 DIAGNOSIS — E782 Mixed hyperlipidemia: Secondary | ICD-10-CM | POA: Diagnosis not present

## 2023-12-16 DIAGNOSIS — Z23 Encounter for immunization: Secondary | ICD-10-CM | POA: Diagnosis not present
